# Patient Record
Sex: Female | Born: 1955 | Race: White | Hispanic: No | Smoking: Former smoker
Health system: Southern US, Community
[De-identification: ages and names within clinical notes are randomized; demographics above are authoritative.]

## PROBLEM LIST (undated history)

## (undated) DIAGNOSIS — I10 Essential (primary) hypertension: Secondary | ICD-10-CM

## (undated) DIAGNOSIS — F419 Anxiety disorder, unspecified: Secondary | ICD-10-CM

## (undated) DIAGNOSIS — F32A Depression, unspecified: Secondary | ICD-10-CM

## (undated) DIAGNOSIS — L409 Psoriasis, unspecified: Secondary | ICD-10-CM

## (undated) DIAGNOSIS — T7840XA Allergy, unspecified, initial encounter: Secondary | ICD-10-CM

## (undated) DIAGNOSIS — J45909 Unspecified asthma, uncomplicated: Secondary | ICD-10-CM

## (undated) DIAGNOSIS — E785 Hyperlipidemia, unspecified: Secondary | ICD-10-CM

## (undated) DIAGNOSIS — H409 Unspecified glaucoma: Secondary | ICD-10-CM

## (undated) DIAGNOSIS — J40 Bronchitis, not specified as acute or chronic: Secondary | ICD-10-CM

## (undated) HISTORY — DX: Psoriasis, unspecified: L40.9

## (undated) HISTORY — DX: Depression, unspecified: F32.A

## (undated) HISTORY — DX: Anxiety disorder, unspecified: F41.9

## (undated) HISTORY — DX: Hyperlipidemia, unspecified: E78.5

## (undated) HISTORY — DX: Allergy, unspecified, initial encounter: T78.40XA

## (undated) HISTORY — DX: Bronchitis, not specified as acute or chronic: J40

## (undated) HISTORY — DX: Unspecified asthma, uncomplicated: J45.909

## (undated) HISTORY — DX: Unspecified glaucoma: H40.9

## (undated) HISTORY — DX: Essential (primary) hypertension: I10

---

## 1998-05-07 ENCOUNTER — Ambulatory Visit (HOSPITAL_COMMUNITY): Admission: RE | Admit: 1998-05-07 | Discharge: 1998-05-07 | Payer: Self-pay | Admitting: Obstetrics and Gynecology

## 2000-12-08 ENCOUNTER — Other Ambulatory Visit: Admission: RE | Admit: 2000-12-08 | Discharge: 2000-12-08 | Payer: Self-pay | Admitting: Obstetrics and Gynecology

## 2000-12-16 ENCOUNTER — Encounter: Payer: Self-pay | Admitting: Obstetrics and Gynecology

## 2000-12-16 ENCOUNTER — Encounter: Admission: RE | Admit: 2000-12-16 | Discharge: 2000-12-16 | Payer: Self-pay | Admitting: Obstetrics and Gynecology

## 2001-12-26 ENCOUNTER — Encounter: Payer: Self-pay | Admitting: Gynecology

## 2001-12-26 ENCOUNTER — Encounter: Admission: RE | Admit: 2001-12-26 | Discharge: 2001-12-26 | Payer: Self-pay | Admitting: Gynecology

## 2003-03-29 ENCOUNTER — Encounter: Payer: Self-pay | Admitting: Gynecology

## 2003-03-29 ENCOUNTER — Encounter: Admission: RE | Admit: 2003-03-29 | Discharge: 2003-03-29 | Payer: Self-pay | Admitting: Gynecology

## 2003-05-02 ENCOUNTER — Other Ambulatory Visit: Admission: RE | Admit: 2003-05-02 | Discharge: 2003-05-02 | Payer: Self-pay | Admitting: Gynecology

## 2004-11-10 ENCOUNTER — Ambulatory Visit: Payer: Self-pay | Admitting: Family Medicine

## 2005-06-25 ENCOUNTER — Ambulatory Visit: Payer: Self-pay | Admitting: Family Medicine

## 2008-05-31 ENCOUNTER — Encounter: Admission: RE | Admit: 2008-05-31 | Discharge: 2008-05-31 | Payer: Self-pay | Admitting: Gynecology

## 2009-11-27 ENCOUNTER — Encounter: Admission: RE | Admit: 2009-11-27 | Discharge: 2009-11-27 | Payer: Self-pay | Admitting: Gynecology

## 2010-10-27 ENCOUNTER — Other Ambulatory Visit: Payer: Self-pay | Admitting: Gynecology

## 2010-10-27 DIAGNOSIS — Z1239 Encounter for other screening for malignant neoplasm of breast: Secondary | ICD-10-CM

## 2010-12-04 ENCOUNTER — Ambulatory Visit
Admission: RE | Admit: 2010-12-04 | Discharge: 2010-12-04 | Disposition: A | Discharge status: Home / Self Care | Payer: 59 | Source: Ambulatory Visit | Attending: Gynecology | Admitting: Gynecology

## 2010-12-04 DIAGNOSIS — Z1239 Encounter for other screening for malignant neoplasm of breast: Secondary | ICD-10-CM

## 2012-02-05 ENCOUNTER — Other Ambulatory Visit: Payer: Self-pay | Admitting: Gynecology

## 2012-02-05 DIAGNOSIS — Z1231 Encounter for screening mammogram for malignant neoplasm of breast: Secondary | ICD-10-CM

## 2012-02-17 ENCOUNTER — Ambulatory Visit
Admission: RE | Admit: 2012-02-17 | Discharge: 2012-02-17 | Disposition: A | Discharge status: Home / Self Care | Payer: BC Managed Care – PPO | Source: Ambulatory Visit | Attending: Gynecology | Admitting: Gynecology

## 2012-02-17 DIAGNOSIS — Z1231 Encounter for screening mammogram for malignant neoplasm of breast: Secondary | ICD-10-CM

## 2013-03-06 ENCOUNTER — Other Ambulatory Visit: Payer: Self-pay

## 2013-03-06 DIAGNOSIS — Z1231 Encounter for screening mammogram for malignant neoplasm of breast: Secondary | ICD-10-CM

## 2013-03-13 ENCOUNTER — Ambulatory Visit
Admission: RE | Admit: 2013-03-13 | Discharge: 2013-03-13 | Disposition: A | Discharge status: Home / Self Care | Payer: BC Managed Care – PPO | Source: Ambulatory Visit

## 2013-03-13 DIAGNOSIS — Z1231 Encounter for screening mammogram for malignant neoplasm of breast: Secondary | ICD-10-CM

## 2013-03-20 ENCOUNTER — Other Ambulatory Visit: Payer: Self-pay | Admitting: Obstetrics and Gynecology

## 2013-03-20 DIAGNOSIS — Z780 Asymptomatic menopausal state: Secondary | ICD-10-CM

## 2013-04-05 ENCOUNTER — Ambulatory Visit
Admission: RE | Admit: 2013-04-05 | Discharge: 2013-04-05 | Disposition: A | Discharge status: Home / Self Care | Payer: BC Managed Care – PPO | Source: Ambulatory Visit | Attending: Obstetrics and Gynecology | Admitting: Obstetrics and Gynecology

## 2013-04-05 DIAGNOSIS — Z780 Asymptomatic menopausal state: Secondary | ICD-10-CM

## 2014-01-04 ENCOUNTER — Other Ambulatory Visit: Payer: Self-pay

## 2014-01-04 DIAGNOSIS — Z1231 Encounter for screening mammogram for malignant neoplasm of breast: Secondary | ICD-10-CM

## 2014-03-14 ENCOUNTER — Encounter (INDEPENDENT_AMBULATORY_CARE_PROVIDER_SITE_OTHER): Payer: Self-pay

## 2014-03-14 ENCOUNTER — Ambulatory Visit
Admission: RE | Admit: 2014-03-14 | Discharge: 2014-03-14 | Disposition: A | Discharge status: Home / Self Care | Payer: BC Managed Care – PPO | Source: Ambulatory Visit

## 2014-03-14 DIAGNOSIS — Z1231 Encounter for screening mammogram for malignant neoplasm of breast: Secondary | ICD-10-CM

## 2015-03-07 ENCOUNTER — Other Ambulatory Visit: Payer: Self-pay

## 2015-03-07 DIAGNOSIS — Z1231 Encounter for screening mammogram for malignant neoplasm of breast: Secondary | ICD-10-CM

## 2015-03-25 DIAGNOSIS — F411 Generalized anxiety disorder: Secondary | ICD-10-CM | POA: Insufficient documentation

## 2015-04-01 ENCOUNTER — Other Ambulatory Visit: Payer: Self-pay | Admitting: Adult Health Nurse Practitioner

## 2015-04-01 DIAGNOSIS — E785 Hyperlipidemia, unspecified: Secondary | ICD-10-CM | POA: Insufficient documentation

## 2015-04-01 DIAGNOSIS — N959 Unspecified menopausal and perimenopausal disorder: Secondary | ICD-10-CM

## 2015-04-29 ENCOUNTER — Ambulatory Visit: Payer: Self-pay

## 2015-04-29 ENCOUNTER — Other Ambulatory Visit: Payer: Self-pay

## 2015-04-29 ENCOUNTER — Ambulatory Visit
Admission: RE | Admit: 2015-04-29 | Discharge: 2015-04-29 | Disposition: A | Discharge status: Home / Self Care | Payer: BLUE CROSS/BLUE SHIELD | Source: Ambulatory Visit

## 2015-04-29 DIAGNOSIS — Z1231 Encounter for screening mammogram for malignant neoplasm of breast: Secondary | ICD-10-CM

## 2015-06-17 ENCOUNTER — Other Ambulatory Visit: Payer: BLUE CROSS/BLUE SHIELD

## 2015-07-29 ENCOUNTER — Ambulatory Visit
Admission: RE | Admit: 2015-07-29 | Discharge: 2015-07-29 | Disposition: A | Discharge status: Home / Self Care | Payer: BLUE CROSS/BLUE SHIELD | Source: Ambulatory Visit | Attending: Adult Health Nurse Practitioner | Admitting: Adult Health Nurse Practitioner

## 2015-07-29 DIAGNOSIS — N959 Unspecified menopausal and perimenopausal disorder: Secondary | ICD-10-CM

## 2016-04-30 ENCOUNTER — Other Ambulatory Visit: Payer: Self-pay | Admitting: Adult Health Nurse Practitioner

## 2016-04-30 DIAGNOSIS — Z1231 Encounter for screening mammogram for malignant neoplasm of breast: Secondary | ICD-10-CM

## 2016-05-18 ENCOUNTER — Ambulatory Visit
Admission: RE | Admit: 2016-05-18 | Discharge: 2016-05-18 | Disposition: A | Discharge status: Home / Self Care | Payer: BLUE CROSS/BLUE SHIELD | Source: Ambulatory Visit | Attending: Adult Health Nurse Practitioner | Admitting: Adult Health Nurse Practitioner

## 2016-05-18 DIAGNOSIS — Z1231 Encounter for screening mammogram for malignant neoplasm of breast: Secondary | ICD-10-CM

## 2017-03-23 ENCOUNTER — Other Ambulatory Visit: Payer: Self-pay | Admitting: Adult Health Nurse Practitioner

## 2017-03-23 DIAGNOSIS — Z1231 Encounter for screening mammogram for malignant neoplasm of breast: Secondary | ICD-10-CM

## 2017-06-02 ENCOUNTER — Ambulatory Visit
Admission: RE | Admit: 2017-06-02 | Discharge: 2017-06-02 | Disposition: A | Discharge status: Home / Self Care | Payer: Commercial Managed Care - PPO | Source: Ambulatory Visit | Attending: Adult Health Nurse Practitioner | Admitting: Adult Health Nurse Practitioner

## 2017-06-02 DIAGNOSIS — Z1231 Encounter for screening mammogram for malignant neoplasm of breast: Secondary | ICD-10-CM

## 2017-12-30 DIAGNOSIS — J302 Other seasonal allergic rhinitis: Secondary | ICD-10-CM | POA: Insufficient documentation

## 2017-12-30 DIAGNOSIS — R7309 Other abnormal glucose: Secondary | ICD-10-CM | POA: Insufficient documentation

## 2018-07-01 DIAGNOSIS — E559 Vitamin D deficiency, unspecified: Secondary | ICD-10-CM | POA: Insufficient documentation

## 2018-07-04 ENCOUNTER — Other Ambulatory Visit: Payer: Self-pay | Admitting: Adult Health Nurse Practitioner

## 2018-07-04 DIAGNOSIS — Z1231 Encounter for screening mammogram for malignant neoplasm of breast: Secondary | ICD-10-CM

## 2018-09-07 ENCOUNTER — Ambulatory Visit
Admission: RE | Admit: 2018-09-07 | Discharge: 2018-09-07 | Disposition: A | Discharge status: Home / Self Care | Payer: Commercial Managed Care - PPO | Source: Ambulatory Visit | Attending: Adult Health Nurse Practitioner | Admitting: Adult Health Nurse Practitioner

## 2018-09-07 DIAGNOSIS — Z1231 Encounter for screening mammogram for malignant neoplasm of breast: Secondary | ICD-10-CM

## 2019-07-06 DIAGNOSIS — Z9229 Personal history of other drug therapy: Secondary | ICD-10-CM

## 2019-07-06 HISTORY — DX: Personal history of other drug therapy: Z92.29

## 2019-08-05 ENCOUNTER — Other Ambulatory Visit: Payer: Self-pay

## 2019-08-05 DIAGNOSIS — Z20822 Contact with and (suspected) exposure to covid-19: Secondary | ICD-10-CM

## 2019-08-06 LAB — NOVEL CORONAVIRUS, NAA: SARS-CoV-2, NAA: DETECTED — AB

## 2019-08-07 ENCOUNTER — Telehealth: Payer: Self-pay | Admitting: Critical Care Medicine

## 2019-08-07 NOTE — Telephone Encounter (Signed)
I connected with this patient regarding her positive Covid test.  Since the 29th she has had headaches dizziness sore throat nasal congestion body aches and fatigue but no cough or shortness of breath.  She has had some diarrhea.  Her urine output is adequate.  She has been staying in isolation.  She works at the [employer] and other workers were positive and she was exposed there.  The [employer] is now closed.  She will let her supervisor know her positive result and I will also email it to her.  The patient has a primary care provider she will follow up with that primary care provider as well.  The patient was asked to go to the emergency room if her symptoms worsen.  She knows to stay in isolation at least until November 9

## 2019-09-14 ENCOUNTER — Other Ambulatory Visit: Payer: Self-pay

## 2019-09-15 ENCOUNTER — Ambulatory Visit: Payer: Self-pay | Admitting: Family Medicine

## 2019-09-20 ENCOUNTER — Other Ambulatory Visit: Payer: Self-pay

## 2019-09-21 ENCOUNTER — Encounter: Payer: Self-pay | Admitting: Family Medicine

## 2019-09-21 ENCOUNTER — Other Ambulatory Visit: Payer: Self-pay

## 2019-09-21 ENCOUNTER — Ambulatory Visit (INDEPENDENT_AMBULATORY_CARE_PROVIDER_SITE_OTHER): Payer: Commercial Managed Care - PPO | Admitting: Family Medicine

## 2019-09-21 VITALS — BP 150/84 | HR 82 | Temp 97.4°F | Ht 64.5 in | Wt 255.4 lb

## 2019-09-21 DIAGNOSIS — Z1159 Encounter for screening for other viral diseases: Secondary | ICD-10-CM

## 2019-09-21 DIAGNOSIS — F411 Generalized anxiety disorder: Secondary | ICD-10-CM

## 2019-09-21 DIAGNOSIS — E559 Vitamin D deficiency, unspecified: Secondary | ICD-10-CM

## 2019-09-21 DIAGNOSIS — Z6841 Body Mass Index (BMI) 40.0 and over, adult: Secondary | ICD-10-CM

## 2019-09-21 DIAGNOSIS — E785 Hyperlipidemia, unspecified: Secondary | ICD-10-CM

## 2019-09-21 DIAGNOSIS — J302 Other seasonal allergic rhinitis: Secondary | ICD-10-CM | POA: Diagnosis not present

## 2019-09-21 DIAGNOSIS — Z23 Encounter for immunization: Secondary | ICD-10-CM

## 2019-09-21 MED ORDER — SHINGRIX 50 MCG/0.5ML IM SUSR: 0.5000 mL | Freq: Once | INTRAMUSCULAR | 0 refills | Status: AC

## 2019-09-21 MED ORDER — SIMVASTATIN 40 MG PO TABS: 40.0000 mg | ORAL_TABLET | Freq: Every day | ORAL | 1 refills | Status: DC

## 2019-09-21 MED ORDER — MONTELUKAST SODIUM 10 MG PO TABS: 10.0000 mg | ORAL_TABLET | Freq: Every day | ORAL | 1 refills | Status: DC

## 2019-09-21 MED ORDER — EZETIMIBE 10 MG PO TABS: 10.0000 mg | ORAL_TABLET | Freq: Every day | ORAL | 1 refills | Status: DC

## 2019-09-21 MED ORDER — SERTRALINE HCL 100 MG PO TABS: 150.0000 mg | ORAL_TABLET | Freq: Every day | ORAL | 1 refills | Status: DC

## 2019-09-21 NOTE — Progress Notes (Signed)
New Patient Office Visit  Assessment & Plan:  1. Anxiety, generalized - Encouraged patient to talk with her husband and explain what is going on with her.  Encouraged her to resume listening to music, even if it was through headphones hook to her phone.  Encouraged her to resume her dancing and exercise.  Offered a referral for counseling but she declines at this time.  I did increase her sertraline from 100 mg to 150 mg once daily. - sertraline (ZOLOFT) 100 MG tablet; Take 1.5 tablets (150 mg total) by mouth daily.  Dispense: 135 tablet; Refill: 1  2. Chronic seasonal allergic rhinitis - Well controlled on current regimen.  - montelukast (SINGULAIR) 10 MG tablet; Take 1 tablet (10 mg total) by mouth at bedtime.  Dispense: 90 tablet; Refill: 1  3. Dyslipidemia - simvastatin (ZOCOR) 40 MG tablet; Take 1 tablet (40 mg total) by mouth at bedtime.  Dispense: 90 tablet; Refill: 1 - ezetimibe (ZETIA) 10 MG tablet; Take 1 tablet (10 mg total) by mouth daily.  Dispense: 90 tablet; Refill: 1  4. Vitamin D deficiency - Patient does take a vitamin D supplement.  5. Morbid obesity with BMI of 40.0-44.9, adult (HCC) - Encouraged patient to resume daily exercise.  Discussed dietary adjustments.  Education provided on obesity with emphasis on diet and exercise control.  6. Immunization due - SHINGRIX injection; Inject 0.5 mLs into the muscle once for 1 dose.  Dispense: 0.5 mL; Refill: 0  7. Encounter for hepatitis C screening test for low risk patient - Hep C antibodies ordered.   Patient reports she gets her lab work for free through work.  I gave her a written prescription for a vitamin D level, lipid panel, CMP, CBC, and hepatitis C antibodies.   Follow-up: Return in about 3 months (around 12/20/2019) for anxiety.   Deliah BostonBritney Kielan Dreisbach, MSN, APRN, FNP-C [facility]  Subjective:  [name] Pursley, female    DOB: 01/17/1956  Age: 63 y.o. MRN: [id number]  Patient  Care Team: Gwenlyn FudgeJoyce, Treyvonne Tata F, FNP as PCP - General (Family Medicine) Chalmers GuestWhitaker, Roy, MD as Consulting Physician (Ophthalmology) Janalyn Harderafeen, Stuart, MD as Consulting Physician (Dermatology) Sandford Crazehompson, Matt, DC as Consulting Physician (Chiropractic Medicine)  CC:  Chief Complaint  Patient presents with  . New Patient (Initial Visit)  . Establish Care    HPI Jasmine Kim Fulco presents to establish care. She is transferring care from Dr. Lowella Kindley [place]'s office as he has retired and the office has closed.   Patient has concerns regarding her coping skills, weight, hepatitis C screening, and Shingrix.   Patient reports that she was prescribed albuterol due to bronchitis.  She also used it when she had Covid at the end of October.  She does still sometimes experience shortness of breath which is relieved with the albuterol.  Patient reports she used to cope with stress by dancing every morning and exercising with her weights.  She also found comfort and completing tasks around the house while listening to the stereo.  Since the current pandemic her husband has been working from home.  She feels she no longer has any time for herself as he is always home.  She is no longer dancing, exercising, or listening to the stereo.  She seems very anxious about tasks at home that are being neglected.  She feels she is unable to get anything done as he is always there and asking her questions.   Dep[facility] screen [facility] 2/9 09/21/2019  Decreased Interest 2  Down, Depressed, Hopeless 1  PHQ - 2 Score 3  Altered sleeping 2  Tired, decreased energy 3  Change in appetite 2  Feeling bad or failure about yourself  1  Trouble concentrating 2  Moving slowly or fidgety/restless 0  Suicidal thoughts 1  PHQ-9 Score 14  Difficult doing work/chores Somewhat difficult   Patient does not have any suicidal plans.  GAD 7 : Generalized Anxiety Score 09/21/2019  Nervous, Anxious, on Edge 3  Control/stop worrying 1  Worry too much -  different things 2  Trouble relaxing 1  Restless 3  Easily annoyed or irritable 2  Afraid - awful might happen 1  Total GAD 7 Score 13  Anxiety Difficulty Somewhat difficult    ROS  Current Outpatient Medications:  .  albuterol (VENTOLIN HFA) 108 (90 Base) MCG/ACT inhaler, SMARTSIG:2 Puff(s) By Mouth Every 4 Hours PRN, Disp: , Rfl:  .  aspirin EC 81 MG tablet, Take 81 mg by mouth daily., Disp: , Rfl:  .  brimonidine (ALPHAGAN) 0.15 % ophthalmic solution, 1 drop 3 (three) times daily., Disp: , Rfl:  .  Calcium Carbonate-Vit D-Min (CALCIUM 1200 PO), Take by mouth., Disp: , Rfl:  .  cholecalciferol (VITAMIN D3) 25 MCG (1000 UT) tablet, Take 1,000 Units by mouth daily., Disp: , Rfl:  .  Clotrimazole (MYCELEX OTC EX), Apply topically., Disp: , Rfl:  .  ezetimibe (ZETIA) 10 MG tablet, Take 1 tablet (10 mg total) by mouth daily., Disp: 90 tablet, Rfl: 1 .  halobetasol (ULTRAVATE) 0.05 % cream, Apply topically 2 (two) times daily., Disp: , Rfl:  .  Homeopathic Products (ZICAM COLD REMEDY NA), Place into the nose., Disp: , Rfl:  .  minocycline (DYNACIN) 50 MG tablet, Take 50 mg by mouth 2 (two) times daily., Disp: , Rfl:  .  montelukast (SINGULAIR) 10 MG tablet, Take 1 tablet (10 mg total) by mouth at bedtime., Disp: 90 tablet, Rfl: 1 .  nystatin-triamcinolone (MYCOLOG II) cream, Apply 1 application topically 2 (two) times daily., Disp: , Rfl:  .  sertraline (ZOLOFT) 100 MG tablet, Take 1.5 tablets (150 mg total) by mouth daily., Disp: 135 tablet, Rfl: 1 .  simvastatin (ZOCOR) 40 MG tablet, Take 1 tablet (40 mg total) by mouth at bedtime., Disp: 90 tablet, Rfl: 1 .  vitamin E (VITAMIN E) 1000 UNIT capsule, Take 1,000 Units by mouth daily., Disp: , Rfl:  .  SHINGRIX injection, Inject 0.5 mLs into the muscle once for 1 dose., Disp: 0.5 mL, Rfl: 0  Allergies  Allergen Reactions  . Procaine Shortness Of Breath  . Niacin Hives    Past Medical History:  Diagnosis Date  . Allergy   . Bronchitis    . Glaucoma   . History of rabies vaccination 07/2019   run in with raccoon  . Hyperlipidemia   . Psoriasis     Past Surgical History:  Procedure Laterality Date  . ABDOMINAL HYSTERECTOMY  1998    Family History  Problem Relation Age of Onset  . Uterine cancer Mother   . Pancreatic cancer Father   . Cancer Brother        Unknown type  . Heart disease Sister   . Colon cancer Sister        69s  . Diabetes Maternal Grandmother   . Hypertension Maternal Grandmother   . Arthritis Maternal Grandmother     Social History   Socioeconomic History  . Marital status: Married  Spouse name: Not on file  . Number of children: Not on file  . Years of education: Not on file  . Highest education level: Not on file  Occupational History  . Not on file  Tobacco Use  . Smoking status: Former Research scientist (life sciences)  . Smokeless tobacco: Never Used  Substance and Sexual Activity  . Alcohol use: Yes    Comment: occ  . Drug use: Never  . Sexual activity: Not Currently  Other Topics Concern  . Not on file  Social History Narrative  . Not on file   Social Determinants of Health   Financial Resource Strain:   . Difficulty of Paying Living Expenses: Not on file  Food Insecurity:   . Worried About Charity fundraiser in the Last Year: Not on file  . Ran Out of Food in the Last Year: Not on file  Transportation Needs:   . Lack of Transportation (Medical): Not on file  . Lack of Transportation (Non-Medical): Not on file  Physical Activity:   . Days of Exercise per Week: Not on file  . Minutes of Exercise per Session: Not on file  Stress:   . Feeling of Stress : Not on file  Social Connections:   . Frequency of Communication with Friends and Family: Not on file  . Frequency of Social Gatherings with Friends and Family: Not on file  . Attends Religious Services: Not on file  . Active Member of Clubs or Organizations: Not on file  . Attends Archivist Meetings: Not on file  .  Marital Status: Not on file  Intimate Partner Violence:   . Fear of Current or Ex-Partner: Not on file  . Emotionally Abused: Not on file  . Physically Abused: Not on file  . Sexually Abused: Not on file    Objective:   Today's Vitals: BP (!) 150/84   Pulse 82   Temp (!) 97.4 F (36.3 C) (Temporal)   Ht 5' 4.5" (1.638 m)   Wt 255 lb 6.4 oz (115.8 kg)   BMI 43.16 kg/m   Physical Exam Vitals reviewed.  Constitutional:      General: She is not in acute distress.    Appearance: Normal appearance. She is morbidly obese. She is not ill-appearing, toxic-appearing or diaphoretic.  HENT:     Head: Normocephalic and atraumatic.  Eyes:     General: No scleral icterus.       Right eye: No discharge.        Left eye: No discharge.     Conjunctiva/sclera: Conjunctivae normal.  Cardiovascular:     Rate and Rhythm: Normal rate and regular rhythm.     Heart sounds: Normal heart sounds. No murmur. No friction rub. No gallop.   Pulmonary:     Effort: Pulmonary effort is normal. No respiratory distress.     Breath sounds: Normal breath sounds. No stridor. No wheezing, rhonchi or rales.  Musculoskeletal:        General: Normal range of motion.     Cervical back: Normal range of motion.  Skin:    General: Skin is warm and dry.     Capillary Refill: Capillary refill takes less than 2 seconds.  Neurological:     General: No focal deficit present.     Mental Status: She is alert and oriented to person, place, and time. Mental status is at baseline.  Psychiatric:        Mood and Affect: Mood normal.  Behavior: Behavior normal.        Thought Content: Thought content normal.        Judgment: Judgment normal.

## 2019-09-21 NOTE — Patient Instructions (Signed)

## 2019-10-28 IMAGING — MG DIGITAL SCREENING BILATERAL MAMMOGRAM WITH TOMO AND CAD
8 series · 8 of 24 positions shown · non-contrast
Comparison: Previous exam(s).

ACR Breast Density Category a: The breast tissue is almost entirely
fatty.

CLINICAL DATA: Screening.

EXAM:
DIGITAL SCREENING BILATERAL MAMMOGRAM WITH TOMO AND CAD

[L MLO synth-2D]
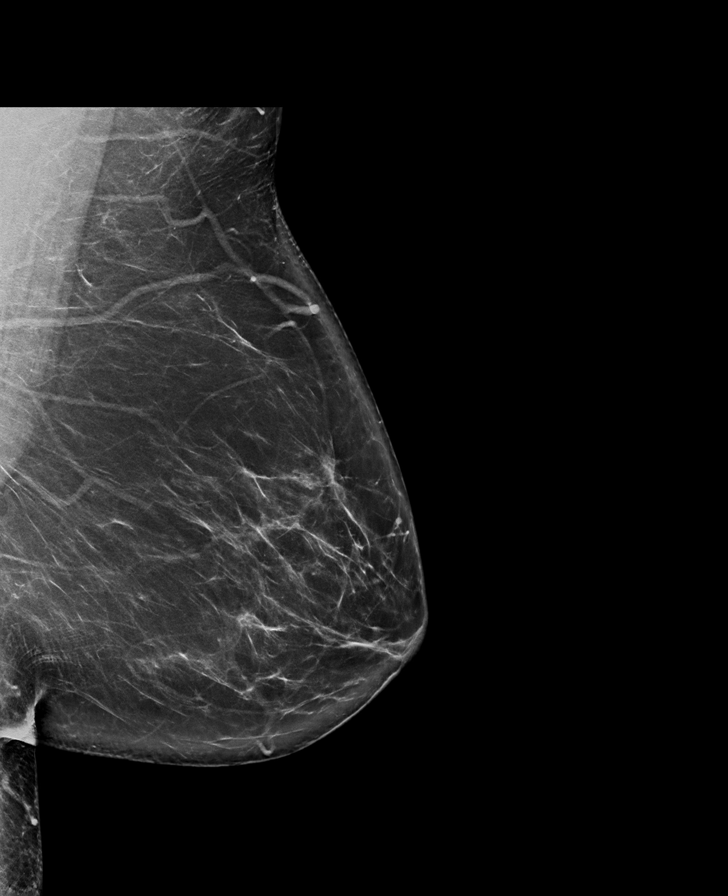

[R MLO synth-2D]
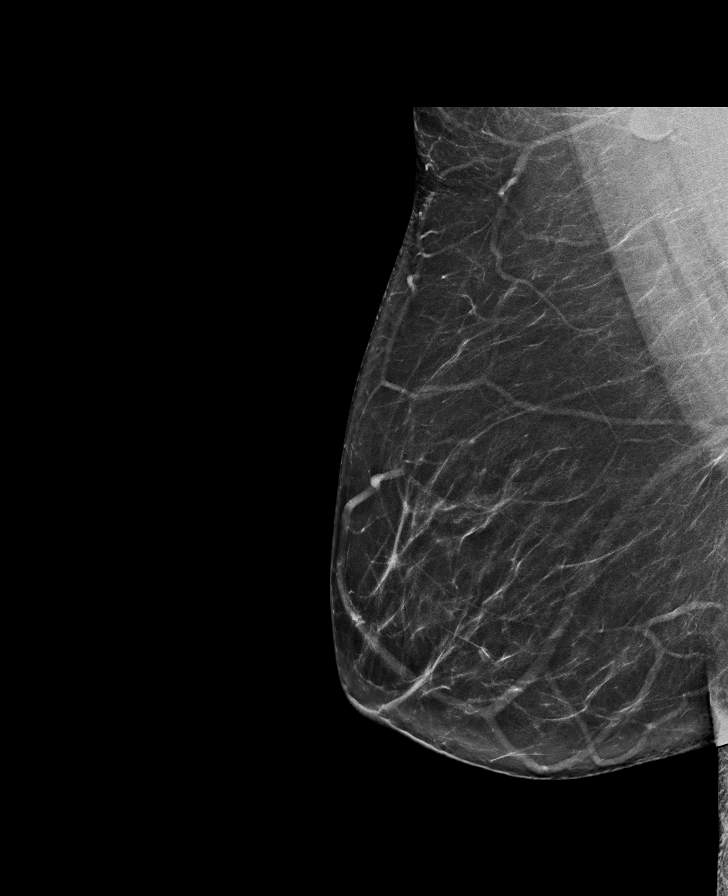

[R CC synth-2D]
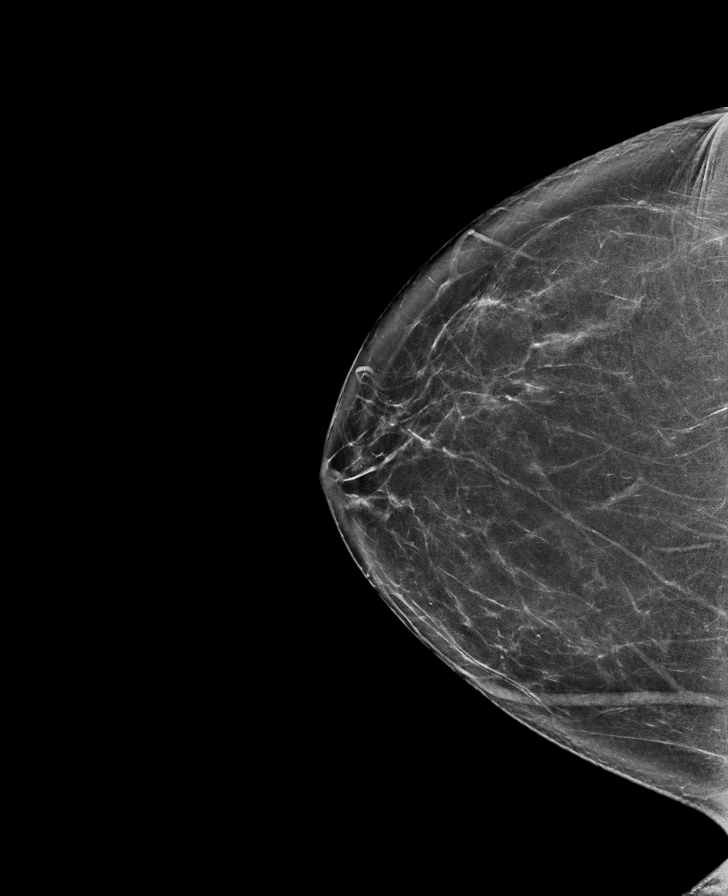

[L CC synth-2D]
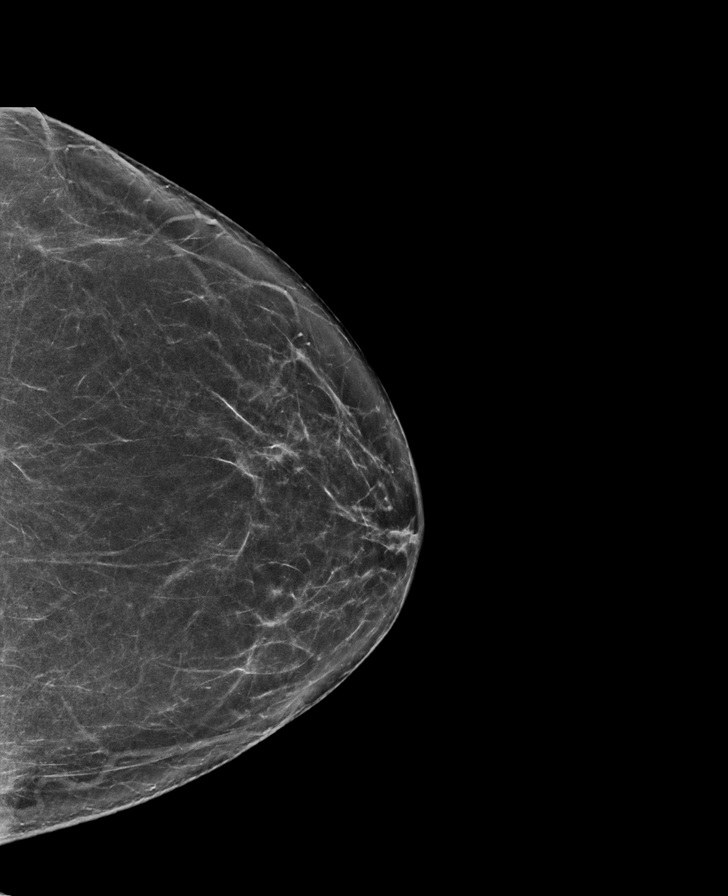

[L CC tomo · tomo slice 37/74.0]
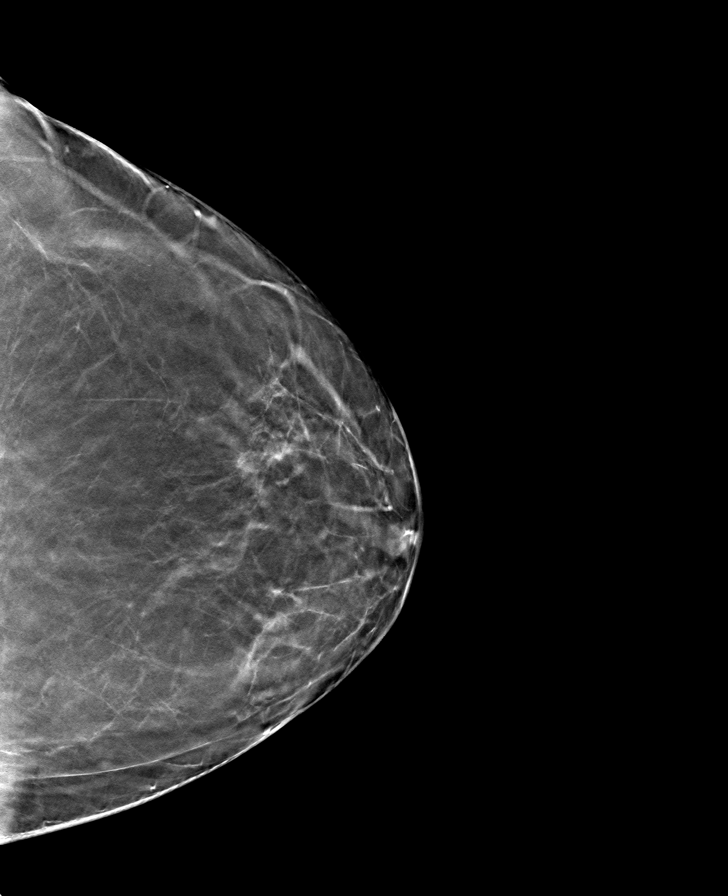

[L MLO tomo · tomo slice 45/89.0]
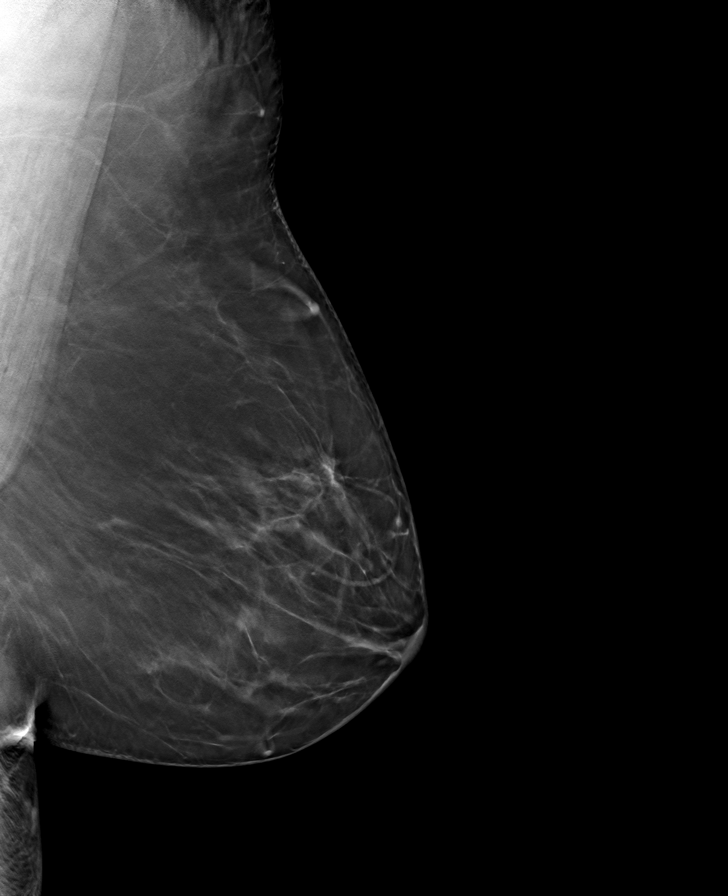

[R MLO tomo · tomo slice 42/83.0]
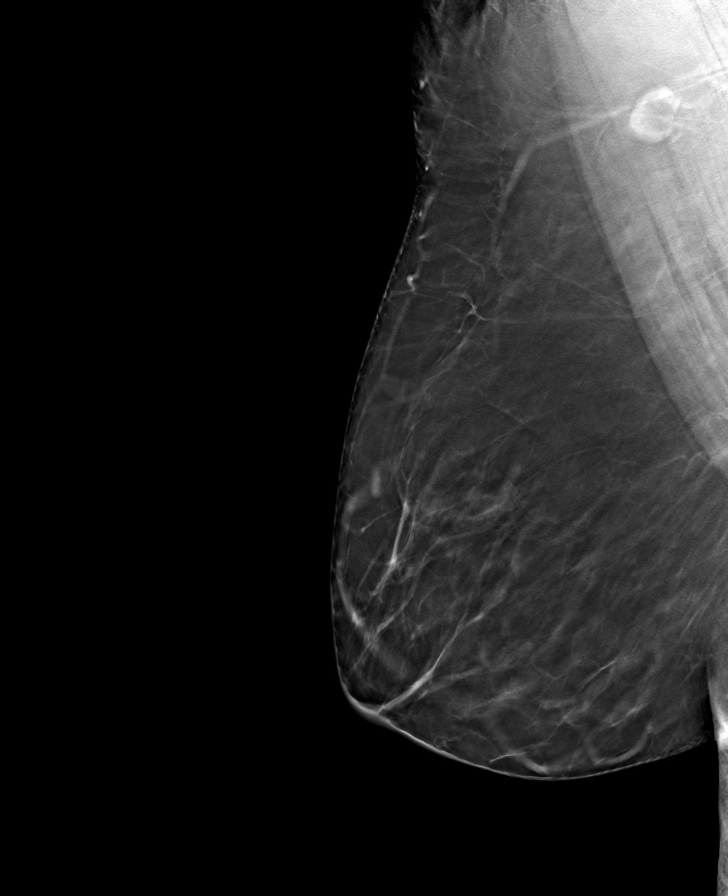

[R CC tomo · tomo slice 39/78.0]
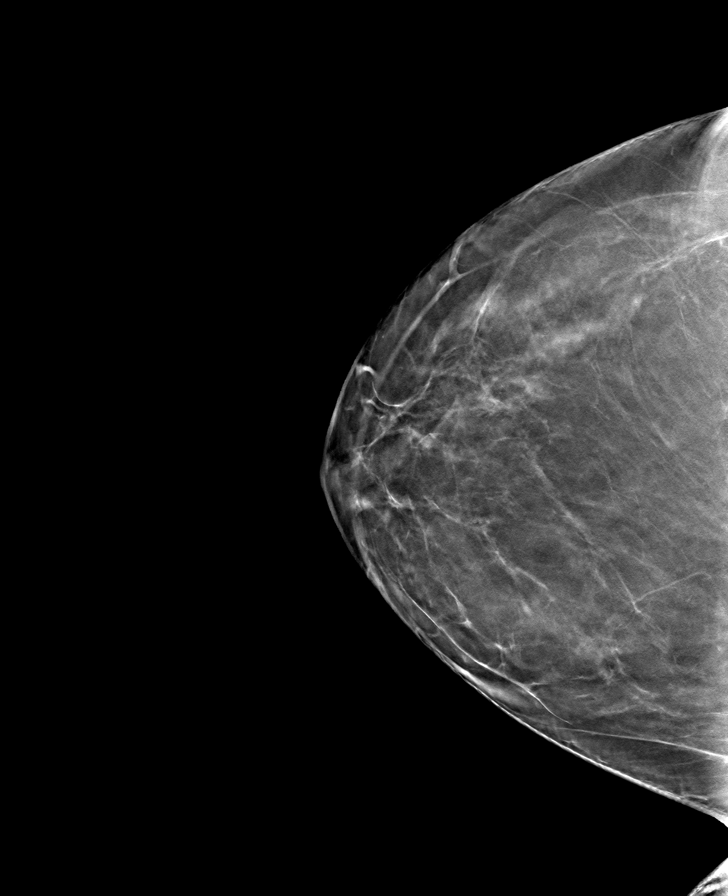

[8 of 24 positions shown; findings below may reference images not displayed]

FINDINGS: There are no findings suspicious for malignancy. Images were
processed with CAD.
IMPRESSION: No mammographic evidence of malignancy. A result letter of this
screening mammogram will be mailed directly to the patient.

RECOMMENDATION:
Screening mammogram in one year. (Code:[id number]-[id number])

BI-RADS CATEGORY  1: Negative.

## 2019-11-13 ENCOUNTER — Other Ambulatory Visit: Payer: Self-pay | Admitting: Family Medicine

## 2019-11-13 DIAGNOSIS — Z1231 Encounter for screening mammogram for malignant neoplasm of breast: Secondary | ICD-10-CM

## 2019-11-23 ENCOUNTER — Encounter: Payer: Self-pay | Admitting: Family Medicine

## 2019-11-28 ENCOUNTER — Telehealth: Payer: Self-pay | Admitting: Family Medicine

## 2019-11-28 NOTE — Telephone Encounter (Signed)
Colon report and path requested from [facility]. Dr. Jennye Boroughs office. Fax number [phone]

## 2019-11-28 NOTE — Telephone Encounter (Signed)
Can we please call to request colonoscopy from Dr. Kinnie Scales? I have attempted to request via Epic already and did not receive it.

## 2019-12-21 ENCOUNTER — Other Ambulatory Visit: Payer: Self-pay

## 2019-12-21 ENCOUNTER — Ambulatory Visit
Admission: RE | Admit: 2019-12-21 | Discharge: 2019-12-21 | Disposition: A | Discharge status: Home / Self Care | Payer: Commercial Managed Care - PPO | Source: Ambulatory Visit | Attending: Family Medicine | Admitting: Family Medicine

## 2019-12-21 ENCOUNTER — Other Ambulatory Visit: Payer: Self-pay | Admitting: *Deleted

## 2019-12-21 DIAGNOSIS — E785 Hyperlipidemia, unspecified: Secondary | ICD-10-CM

## 2019-12-21 DIAGNOSIS — Z1231 Encounter for screening mammogram for malignant neoplasm of breast: Secondary | ICD-10-CM

## 2019-12-21 MED ORDER — SIMVASTATIN 40 MG PO TABS: 40.0000 mg | ORAL_TABLET | Freq: Every day | ORAL | 0 refills | Status: DC

## 2020-01-17 ENCOUNTER — Other Ambulatory Visit: Payer: Self-pay | Admitting: Family Medicine

## 2020-01-17 DIAGNOSIS — E785 Hyperlipidemia, unspecified: Secondary | ICD-10-CM

## 2020-01-18 MED ORDER — SIMVASTATIN 40 MG PO TABS: 40.0000 mg | ORAL_TABLET | Freq: Every day | ORAL | 0 refills | Status: DC

## 2020-01-18 NOTE — Telephone Encounter (Signed)
Please schedule appt for follow up before any more refills

## 2020-01-23 ENCOUNTER — Telehealth: Payer: Self-pay

## 2020-01-23 ENCOUNTER — Other Ambulatory Visit: Payer: Self-pay | Admitting: Family Medicine

## 2020-01-23 DIAGNOSIS — E785 Hyperlipidemia, unspecified: Secondary | ICD-10-CM

## 2020-01-23 DIAGNOSIS — F411 Generalized anxiety disorder: Secondary | ICD-10-CM

## 2020-01-23 MED ORDER — SERTRALINE HCL 100 MG PO TABS: 150.0000 mg | ORAL_TABLET | Freq: Every day | ORAL | 1 refills | Status: DC

## 2020-01-23 NOTE — Telephone Encounter (Signed)
Who increased the dosage?

## 2020-01-23 NOTE — Telephone Encounter (Signed)
Sertraline refilled.  

## 2020-01-23 NOTE — Telephone Encounter (Signed)
Spoke with patient, she had gotten confused about her medications.  She had increased her Sertraline to 1-1/2 pills and needs a refill of that, not her Simvastatin.  She accidentally sent [place] a refill request for the wrong medication.  Patient reports Sertraline was increased at her last office visit.

## 2020-01-23 NOTE — Telephone Encounter (Signed)
Pharmacy has requested a refill of patient's Simvastatin.  Our records indicate patient is taking 40 mg but Jasmine Kim received something that leads her to think that dosage was changed to 60 mg.  Tried to contact the patient to confirm.  Left message for patient to call back.

## 2020-06-04 ENCOUNTER — Encounter: Payer: Self-pay | Admitting: Family Medicine

## 2020-06-18 ENCOUNTER — Encounter: Payer: Self-pay | Admitting: Family Medicine

## 2020-06-18 DIAGNOSIS — R7303 Prediabetes: Secondary | ICD-10-CM | POA: Insufficient documentation

## 2020-06-18 HISTORY — DX: Prediabetes: R73.03

## 2020-06-18 LAB — LIPID PANEL
Cholesterol: 225 — AB (ref 0–200)
HDL: 51 (ref 35–70)
LDL Cholesterol: 147
Triglycerides: 149 (ref 40–160)

## 2020-06-18 LAB — HEPATIC FUNCTION PANEL
ALT: 25 (ref 7–35)
AST: 19 (ref 13–35)
Alkaline Phosphatase: 94 (ref 25–125)

## 2020-06-18 LAB — CBC AND DIFFERENTIAL
HCT: 39 (ref 36–46)
Hemoglobin: 13.2 (ref 12.0–16.0)
WBC: 7.6

## 2020-06-18 LAB — COMPREHENSIVE METABOLIC PANEL
Albumin: 4.6 (ref 3.5–5.0)
Calcium: 9.3 (ref 8.7–10.7)
GFR calc non Af Amer: 85
Globulin: 2.6

## 2020-06-18 LAB — BASIC METABOLIC PANEL
BUN: 13 (ref 4–21)
CO2: 23 — AB (ref 13–22)
Chloride: 102 (ref 99–108)
Creatinine: 0.7 (ref 0.5–1.1)
Glucose: 111
Potassium: 4.3 (ref 3.4–5.3)
Sodium: 141 (ref 137–147)

## 2020-06-18 LAB — CBC: RBC: 4.37 (ref 3.87–5.11)

## 2020-06-18 LAB — HEMOGLOBIN A1C: Hemoglobin A1C: 6

## 2020-06-26 ENCOUNTER — Ambulatory Visit (INDEPENDENT_AMBULATORY_CARE_PROVIDER_SITE_OTHER): Payer: Commercial Managed Care - PPO | Admitting: Family Medicine

## 2020-06-26 ENCOUNTER — Encounter: Payer: Self-pay | Admitting: Family Medicine

## 2020-06-26 ENCOUNTER — Other Ambulatory Visit: Payer: Self-pay

## 2020-06-26 VITALS — BP 151/87 | HR 69 | Temp 97.7°F | Ht 64.5 in | Wt 253.8 lb

## 2020-06-26 DIAGNOSIS — J302 Other seasonal allergic rhinitis: Secondary | ICD-10-CM

## 2020-06-26 DIAGNOSIS — E785 Hyperlipidemia, unspecified: Secondary | ICD-10-CM

## 2020-06-26 DIAGNOSIS — F411 Generalized anxiety disorder: Secondary | ICD-10-CM

## 2020-06-26 DIAGNOSIS — R7303 Prediabetes: Secondary | ICD-10-CM | POA: Diagnosis not present

## 2020-06-26 DIAGNOSIS — E559 Vitamin D deficiency, unspecified: Secondary | ICD-10-CM

## 2020-06-26 DIAGNOSIS — M1990 Unspecified osteoarthritis, unspecified site: Secondary | ICD-10-CM

## 2020-06-26 DIAGNOSIS — M25522 Pain in left elbow: Secondary | ICD-10-CM

## 2020-06-26 DIAGNOSIS — Z0000 Encounter for general adult medical examination without abnormal findings: Secondary | ICD-10-CM

## 2020-06-26 DIAGNOSIS — Z6841 Body Mass Index (BMI) 40.0 and over, adult: Secondary | ICD-10-CM

## 2020-06-26 MED ORDER — MONTELUKAST SODIUM 10 MG PO TABS: 10.0000 mg | ORAL_TABLET | Freq: Every day | ORAL | 1 refills | Status: DC

## 2020-06-26 MED ORDER — ALBUTEROL SULFATE HFA 108 (90 BASE) MCG/ACT IN AERS: INHALATION_SPRAY | RESPIRATORY_TRACT | 2 refills | Status: DC

## 2020-06-26 MED ORDER — PREDNISONE 10 MG (21) PO TBPK: ORAL_TABLET | ORAL | 0 refills | Status: DC

## 2020-06-26 MED ORDER — EZETIMIBE 10 MG PO TABS: 10.0000 mg | ORAL_TABLET | Freq: Every day | ORAL | 1 refills | Status: DC

## 2020-06-26 MED ORDER — SERTRALINE HCL 100 MG PO TABS: 200.0000 mg | ORAL_TABLET | Freq: Every day | ORAL | 1 refills | Status: DC

## 2020-06-26 NOTE — Progress Notes (Signed)
Assessment & Plan:  1. Anxiety, generalized - Uncontrolled.  Sertraline increased from 150 mg to 200 mg once daily.  Recommended a referral for counseling to help with her coping skills, but patient declines. - sertraline (ZOLOFT) 100 MG tablet; Take 2 tablets (200 mg total) by mouth daily.  Dispense: 180 tablet; Refill: 1  2. Chronic seasonal allergic rhinitis - Well controlled on current regimen.  - montelukast (SINGULAIR) 10 MG tablet; Take 1 tablet (10 mg total) by mouth at bedtime.  Dispense: 90 tablet; Refill: 1  3. Dyslipidemia - Uncontrolled per last lab work.  Patient to work on lifestyle changes. - ezetimibe (ZETIA) 10 MG tablet; Take 1 tablet (10 mg total) by mouth daily.  Dispense: 90 tablet; Refill: 1  4. Prediabetes - Diet and exercise encouraged to keep from progressing to diabetes.  5. Vitamin D deficiency - Control unknown as lab work was not completed as requested.  Patient not agreeable to completing today.  6. Morbid obesity with BMI of 40.0-44.9, adult (HCC) - Diet and exercise encouraged.  7. Healthcare maintenance - Patient is going to check on price of Shingrix at her pharmacy.  She is also going to try to get her colonoscopy record as we have been unsuccessful.  8. Left elbow pain - predniSONE (STERAPRED UNI-PAK 21 TAB) 10 MG (21) TBPK tablet; As directed x 6 days  Dispense: 21 tablet; Refill: 0  9. Arthritis  - Encouraged patient to take Advil 400-600 mg every 6-8 hours as needed for pain.    Return in about 6 weeks (around 08/07/2020) for anxiety/depression.  Deliah Boston, MSN, APRN, FNP-C [facility]  Subjective:    [name], female    DOB: March 06, 1956, 64 y.o.   MRN: [id number]  Patient Care Team: Gwenlyn Fudge, FNP as PCP - General (Family Medicine) Chalmers Guest, MD as Consulting Physician (Ophthalmology) Janalyn Harder, MD as Consulting Physician (Dermatology) Sandford Craze, DC as Consulting  Physician (Chiropractic Medicine)   Chief Complaint:  Chief Complaint  Patient presents with  . Diabetes    check up of chronic medical conditions  . Hyperlipidemia  . Elbow Pain    Left - Patient states it has been going on x 1 month    HPI: Jasmine Kim is a 64 y.o. female presenting on 06/26/2020 for Diabetes (check up of chronic medical conditions), Hyperlipidemia, and Elbow Pain (Left - Patient states it has been going on x 1 month)  At patient's last visit sertraline was increased from 100 mg to 150 mg once daily.  Patient reports she felt it was helping until recently.  She would like a dosage increase.  She also wants help with her coping skills.  Depression screen [facility] 2/9 06/26/2020 09/21/2019  Decreased Interest 2 2  Down, Depressed, Hopeless 2 1  PHQ - 2 Score 4 3  Altered sleeping 3 2  Tired, decreased energy 3 3  Change in appetite 1 2  Feeling bad or failure about yourself  2 1  Trouble concentrating 1 2  Moving slowly or fidgety/restless 2 0  Suicidal thoughts 1 1  PHQ-9 Score 17 14  Difficult doing work/chores - Somewhat difficult   GAD 7 : Generalized Anxiety Score 06/26/2020 09/21/2019  Nervous, Anxious, on Edge 2 3  Control/stop worrying 2 1  Worry too much - different things 3 2  Trouble relaxing 2 1  Restless 2 3  Easily annoyed or irritable 3 2  Afraid - awful  might happen 1 1  Total GAD 7 Score 15 13  Anxiety Difficulty - Somewhat difficult    Patient 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  She was notified of the diagnosis of prediabetes.  They did not draw a vitamin D level as we had requested.  New complaints: Patient complains of left elbow pain x1 month.  She describes it as being stiff and achy.  She fell on it approximately 4 months ago.  She has been taking Advil 200 mg or Tylenol 500 mg once daily.  Patient also reports pain in her fingers/knuckles.  Social history:  Relevant past medical, surgical,  family and social history reviewed and updated as indicated. Interim medical history since our last visit reviewed.  Allergies and medications reviewed and updated.  DATA REVIEWED: CHART IN EPIC  ROS: Negative unless specifically indicated above in HPI.    Current Outpatient Medications:  .  albuterol (VENTOLIN HFA) 108 (90 Base) MCG/ACT inhaler, SMARTSIG:2 Puff(s) By Mouth Every 4 Hours PRN, Disp: , Rfl:  .  aspirin EC 81 MG tablet, Take 81 mg by mouth daily., Disp: , Rfl:  .  brimonidine (ALPHAGAN) 0.15 % ophthalmic solution, 1 drop 3 (three) times daily., Disp: , Rfl:  .  Calcium Carbonate-Vit D-Min (CALCIUM 1200 PO), Take by mouth., Disp: , Rfl:  .  cholecalciferol (VITAMIN D3) 25 MCG (1000 UT) tablet, Take 1,000 Units by mouth daily., Disp: , Rfl:  .  Clotrimazole (MYCELEX OTC EX), Apply topically., Disp: , Rfl:  .  ezetimibe (ZETIA) 10 MG tablet, Take 1 tablet (10 mg total) by mouth daily., Disp: 90 tablet, Rfl: 1 .  halobetasol (ULTRAVATE) 0.05 % cream, Apply topically 2 (two) times daily., Disp: , Rfl:  .  Homeopathic Products (ZICAM COLD REMEDY NA), Place into the nose., Disp: , Rfl:  .  minocycline (DYNACIN) 50 MG tablet, Take 50 mg by mouth 2 (two) times daily., Disp: , Rfl:  .  montelukast (SINGULAIR) 10 MG tablet, Take 1 tablet (10 mg total) by mouth at bedtime., Disp: 90 tablet, Rfl: 1 .  nystatin-triamcinolone (MYCOLOG II) cream, Apply 1 application topically 2 (two) times daily., Disp: , Rfl:  .  sertraline (ZOLOFT) 100 MG tablet, Take 1.5 tablets (150 mg total) by mouth daily., Disp: 135 tablet, Rfl: 1 .  simvastatin (ZOCOR) 40 MG tablet, Take 1 tablet (40 mg total) by mouth at bedtime., Disp: 30 tablet, Rfl: 0 .  vitamin E (VITAMIN E) 1000 UNIT capsule, Take 1,000 Units by mouth daily., Disp: , Rfl:    Allergies  Allergen Reactions  . Procaine Shortness Of Breath  . Niacin Hives   Past Medical History:  Diagnosis Date  . Allergy   . Bronchitis   . Glaucoma   .  History of rabies vaccination 07/2019   run in with raccoon  . Hyperlipidemia   . Prediabetes 06/18/2020  . Psoriasis     Past Surgical History:  Procedure Laterality Date  . ABDOMINAL HYSTERECTOMY  1998    Social History   Socioeconomic History  . Marital status: Married    Spouse name: Not on file  . Number of children: Not on file  . Years of education: Not on file  . Highest education level: Not on file  Occupational History  . Not on file  Tobacco Use  . Smoking status: Former Games developer  . Smokeless tobacco: Never Used  Vaping Use  . Vaping Use: Never used  Substance and Sexual Activity  . Alcohol use: Yes    Comment: occ  . Drug use: Never  . Sexual activity: Not Currently  Other Topics Concern  . Not on file  Social History Narrative  . Not on file   Social Determinants of Health   Financial Resource Strain:   . Difficulty of Paying Living Expenses: Not on file  Food Insecurity:   . Worried About Programme researcher, broadcasting/film/video in the Last Year: Not on file  . Ran Out of Food in the Last Year: Not on file  Transportation Needs:   . Lack of Transportation (Medical): Not on file  . Lack of Transportation (Non-Medical): Not on file  Physical Activity:   . Days of Exercise per Week: Not on file  . Minutes of Exercise per Session: Not on file  Stress:   . Feeling of Stress : Not on file  Social Connections:   . Frequency of Communication with Friends and Family: Not on file  . Frequency of Social Gatherings with Friends and Family: Not on file  . Attends Religious Services: Not on file  . Active Member of Clubs or Organizations: Not on file  . Attends Banker Meetings: Not on file  . Marital Status: Not on file  Intimate Partner Violence:   . Fear of Current or Ex-Partner: Not on file  . Emotionally Abused: Not on file  . Physically Abused: Not on file  . Sexually Abused: Not on file        Objective:    BP (!) 151/87   Pulse 69   Temp 97.7 F  (36.5 C) (Temporal)   Ht 5' 4.5" (1.638 m)   Wt 253 lb 12.8 oz (115.1 kg)   SpO2 93%   BMI 42.89 kg/m   Wt Readings from Last 3 Encounters:  06/26/20 253 lb 12.8 oz (115.1 kg)  09/21/19 255 lb 6.4 oz (115.8 kg)    Physical Exam Vitals reviewed.  Constitutional:      General: She is not in acute distress.    Appearance: Normal appearance. She is morbidly obese. She is not ill-appearing, toxic-appearing or diaphoretic.  HENT:     Head: Normocephalic and atraumatic.  Eyes:     General: No scleral icterus.       Right eye: No discharge.        Left eye: No discharge.     Conjunctiva/sclera: Conjunctivae normal.  Cardiovascular:     Rate and Rhythm: Normal rate and regular rhythm.     Heart sounds: Normal heart sounds. No murmur heard.  No friction rub. No gallop.   Pulmonary:     Effort: Pulmonary effort is normal. No respiratory distress.     Breath sounds: Normal breath sounds. No stridor. No wheezing, rhonchi or rales.  Musculoskeletal:        General: Normal range of motion.     Left elbow: No swelling, deformity, effusion or lacerations. Normal range of motion. Tenderness present in lateral epicondyle.     Cervical back: Normal range of motion.  Skin:    General: Skin is warm and dry.     Capillary Refill: Capillary refill takes less than 2 seconds.  Neurological:     General: No focal deficit present.     Mental Status: She is alert and oriented to person, place, and time. Mental status is at baseline.  Psychiatric:        Mood and Affect: Mood normal.  Behavior: Behavior normal.        Thought Content: Thought content normal.        Judgment: Judgment normal.     No results found for: TSH Lab Results  Component Value Date   WBC 7.6 06/18/2020   HGB 13.2 06/18/2020   HCT 39 06/18/2020   Lab Results  Component Value Date   NA 141 06/18/2020   K 4.3 06/18/2020   CO2 23 (A) 06/18/2020   BUN 13 06/18/2020   CREATININE 0.7 06/18/2020   ALKPHOS 94  06/18/2020   AST 19 06/18/2020   ALT 25 06/18/2020   ALBUMIN 4.6 06/18/2020   CALCIUM 9.3 06/18/2020   Lab Results  Component Value Date   CHOL 225 (A) 06/18/2020   Lab Results  Component Value Date   HDL 51 06/18/2020   Lab Results  Component Value Date   LDLCALC 147 06/18/2020   Lab Results  Component Value Date   TRIG 149 06/18/2020   No results found for: [facility] Corporation[facility]OLHDL Lab Results  Component Value Date   HGBA1C 6.0 06/18/2020

## 2020-06-26 NOTE — Patient Instructions (Signed)
Try to obtain colonoscopy results or have them sent to [place].   Go get your shingles vaccine.  Advil 400-600 mg every 6-8 hours as needed for pain.

## 2020-07-07 ENCOUNTER — Encounter: Payer: Self-pay | Admitting: Family Medicine

## 2020-08-06 ENCOUNTER — Encounter: Payer: Self-pay | Admitting: Nurse Practitioner

## 2020-08-06 ENCOUNTER — Other Ambulatory Visit: Payer: Self-pay

## 2020-08-06 ENCOUNTER — Ambulatory Visit (INDEPENDENT_AMBULATORY_CARE_PROVIDER_SITE_OTHER): Payer: Commercial Managed Care - PPO | Admitting: Nurse Practitioner

## 2020-08-06 VITALS — BP 148/78 | HR 77 | Temp 97.0°F | Ht 64.5 in | Wt 258.0 lb

## 2020-08-06 DIAGNOSIS — M25522 Pain in left elbow: Secondary | ICD-10-CM | POA: Diagnosis not present

## 2020-08-06 DIAGNOSIS — F339 Major depressive disorder, recurrent, unspecified: Secondary | ICD-10-CM

## 2020-08-06 DIAGNOSIS — F411 Generalized anxiety disorder: Secondary | ICD-10-CM | POA: Diagnosis not present

## 2020-08-06 MED ORDER — IBUPROFEN 600 MG PO TABS: 600.0000 mg | ORAL_TABLET | Freq: Three times a day (TID) | ORAL | 0 refills | Status: DC | PRN

## 2020-08-06 MED ORDER — DICLOFENAC SODIUM 1 % EX GEL: 2.0000 g | Freq: Four times a day (QID) | CUTANEOUS | 2 refills | Status: DC

## 2020-08-06 NOTE — Patient Instructions (Signed)
Patient depression and anxiety improved on current medication no changes necessary. Follow-up in 3 months.  Living With Depression Everyone experiences occasional disappointment, sadness, and loss in their lives. When you are feeling down, blue, or sad for at least 2 weeks in a row, it may mean that you have depression. Depression can affect your thoughts and feelings, relationships, daily activities, and physical health. It is caused by changes in the way your brain functions. If you receive a diagnosis of depression, your health care provider will tell you which type of depression you have and what treatment options are available to you. If you are living with depression, there are ways to help you recover from it and also ways to prevent it from coming back. How to cope with lifestyle changes Coping with stress     Stress is your body's reaction to life changes and events, both good and bad. Stressful situations may include:  Getting married.  The death of a spouse.  Losing a job.  Retiring.  Having a baby. Stress can last just a few hours or it can be ongoing. Stress can play a major role in depression, so it is important to learn both how to cope with stress and how to think about it differently. Talk with your health care provider or a counselor if you would like to learn more about stress reduction. He or she may suggest some stress reduction techniques, such as:  Music therapy. This can include creating music or listening to music. Choose music that you enjoy and that inspires you.  Mindfulness-based meditation. This kind of meditation can be done while sitting or walking. It involves being aware of your normal breaths, rather than trying to control your breathing.  Centering prayer. This is a kind of meditation that involves focusing on a spiritual word or phrase. Choose a word, phrase, or sacred image that is meaningful to you and that brings you peace.  Deep breathing. To  do this, expand your stomach and inhale slowly through your nose. Hold your breath for 3-5 seconds, then exhale slowly, allowing your stomach muscles to relax.  Muscle relaxation. This involves intentionally tensing muscles then relaxing them. Choose a stress reduction technique that fits your lifestyle and personality. Stress reduction techniques take time and practice to develop. Set aside 5-15 minutes a day to do them. Therapists can offer training in these techniques. The training may be covered by some insurance plans. Other things you can do to manage stress include:  Keeping a stress diary. This can help you learn what triggers your stress and ways to control your response.  Understanding what your limits are and saying no to requests or events that lead to a schedule that is too full.  Thinking about how you respond to certain situations. You may not be able to control everything, but you can control how you react.  Adding humor to your life by watching funny films or TV shows.  Making time for activities that help you relax and not feeling guilty about spending your time this way.  Medicines Your health care provider may suggest certain medicines if he or she feels that they will help improve your condition. Avoid using alcohol and other substances that may prevent your medicines from working properly (may interact). It is also important to:  Talk with your pharmacist or health care provider about all the medicines that you take, their possible side effects, and what medicines are safe to take together.  Make it  your goal to take part in all treatment decisions (shared decision-making). This includes giving input on the side effects of medicines. It is best if shared decision-making with your health care provider is part of your total treatment plan. If your health care provider prescribes a medicine, you may not notice the full benefits of it for 4-8 weeks. Most people who are treated  for depression need to be on medicine for at least 6-12 months after they feel better. If you are taking medicines as part of your treatment, do not stop taking medicines without first talking to your health care provider. You may need to have the medicine slowly decreased (tapered) over time to decrease the risk of harmful side effects. Relationships Your health care provider may suggest family therapy along with individual therapy and drug therapy. While there may not be family problems that are causing you to feel depressed, it is still important to make sure your family learns as much as they can about your mental health. Having your family's support can help make your treatment successful. How to recognize changes in your condition Everyone has a different response to treatment for depression. Recovery from major depression happens when you have not had signs of major depression for two months. This may mean that you will start to:  Have more interest in doing activities.  Feel less hopeless than you did 2 months ago.  Have more energy.  Overeat less often, or have better or improving appetite.  Have better concentration. Your health care provider will work with you to decide the next steps in your recovery. It is also important to recognize when your condition is getting worse. Watch for these signs:  Having fatigue or low energy.  Eating too much or too little.  Sleeping too much or too little.  Feeling restless, agitated, or hopeless.  Having trouble concentrating or making decisions.  Having unexplained physical complaints.  Feeling irritable, angry, or aggressive. Get help as soon as you or your family members notice these symptoms coming back. How to get support and help from others How to talk with friends and family members about your condition  Talking to friends and family members about your condition can provide you with one way to get support and guidance. Reach out  to trusted friends or family members, explain your symptoms to them, and let them know that you are working with a health care provider to treat your depression. Financial resources Not all insurance plans cover mental health care, so it is important to check with your insurance carrier. If paying for co-pays or counseling services is a problem, search for a local or county mental health care center. They may be able to offer public mental health care services at low or no cost when you are not able to see a private health care provider. If you are taking medicine for depression, you may be able to get the generic form, which may be less expensive. Some makers of prescription medicines also offer help to patients who cannot afford the medicines they need. Follow these instructions at home:   Get the right amount and quality of sleep.  Cut down on using caffeine, tobacco, alcohol, and other potentially harmful substances.  Try to exercise, such as walking or lifting small weights.  Take over-the-counter and prescription medicines only as told by your health care provider.  Eat a healthy diet that includes plenty of vegetables, fruits, whole grains, low-fat dairy products, and lean protein. Do  not eat a lot of foods that are high in solid fats, added sugars, or salt.  Keep all follow-up visits as told by your health care provider. This is important. Contact a health care provider if:  You stop taking your antidepressant medicines, and you have any of these symptoms: ? Nausea. ? Headache. ? Feeling lightheaded. ? Chills and body aches. ? Not being able to sleep (insomnia).  You or your friends and family think your depression is getting worse. Get help right away if:  You have thoughts of hurting yourself or others. If you ever feel like you may hurt yourself or others, or have thoughts about taking your own life, get help right away. You can go to your nearest emergency department or  call:  Your local emergency services (911 in the [place]  A suicide crisis helpline, such as the [facility] at 813 367 4475. This is open 24-hours a day. Summary  If you are living with depression, there are ways to help you recover from it and also ways to prevent it from coming back.  Work with your health care team to create a management plan that includes counseling, stress management techniques, and healthy lifestyle habits. This information is not intended to replace advice given to you by your health care provider. Make sure you discuss any questions you have with your health care provider. Document Revised: 01/13/2019 Document Reviewed: 08/24/2016 Elsevier Patient Education  [place].

## 2020-08-06 NOTE — Assessment & Plan Note (Signed)
Depression well controlled on current medication no changes to medication dose.

## 2020-08-06 NOTE — Assessment & Plan Note (Signed)
Left elbow pain not well managed. This is not new for patient. Provided education with printed handouts given. Ibuprofen as needed, ice application, rest elbow, Voltaren topical gel.  Follow-up with worsening or unresolved symptoms.  Rx sent to pharmacy.

## 2020-08-06 NOTE — Assessment & Plan Note (Signed)
Well-managed: The patient no changes necessary. Patient is currently on Zoloft 100 mg , 2 tablets daily.

## 2020-08-06 NOTE — Progress Notes (Signed)
Established Patient Office Visit  Subjective:  [name], female    DOB: 12-27-1955  Age: 64 y.o. MRN: [id number]  CC:  Chief Complaint  Patient presents with  . Anxiety    6 week follow up  . Depression    HPI Jasmine Kim presents for Anxiety Presents for follow-up visit. Symptoms include nervous/anxious behavior and shortness of breath. Patient reports no chest pain, compulsions, feeling of choking, muscle tension or suicidal ideas. Symptoms occur most days. The severity of symptoms is mild. The quality of sleep is fair.   Her past medical history is significant for depression. There is no history of suicide attempts. Compliance with medications is 76-100%.  Depression      The patient presents with depression.  This is a recurrent problem.  The current episode started more than 1 year ago.   The onset quality is gradual.   The problem occurs every several days.  The problem has been gradually improving since onset.  Associated symptoms include body aches and myalgias.  Associated symptoms include no decreased interest and no suicidal ideas.     The symptoms are aggravated by nothing.  Past treatments include SSRIs - Selective serotonin reuptake inhibitors.  Compliance with treatment is good.  Past medical history includes anxiety and depression.     Pertinent negatives include no suicide attempts.  Left elbow pain This is new for patient in the last few weeks. Patient reports pain has been ongoing after COVID-19 infection. Patient has used steroids in the past with mild relief. Patient reports taking and unrelenting pain left elbow radiating down fingers.   Past Medical History:  Diagnosis Date  . Allergy   . Bronchitis   . Glaucoma   . History of rabies vaccination 07/2019   run in with raccoon  . Hyperlipidemia   . Prediabetes 06/18/2020  . Psoriasis     Past Surgical History:  Procedure Laterality Date  . ABDOMINAL HYSTERECTOMY  1998    Family  History  Problem Relation Age of Onset  . Uterine cancer Mother   . Pancreatic cancer Father   . Cancer Brother        Unknown type  . Heart disease Sister   . Colon cancer Sister        45s  . Diabetes Maternal Grandmother   . Hypertension Maternal Grandmother   . Arthritis Maternal Grandmother     Social History   Socioeconomic History  . Marital status: Married    Spouse name: Not on file  . Number of children: Not on file  . Years of education: Not on file  . Highest education level: Not on file  Occupational History  . Not on file  Tobacco Use  . Smoking status: Former Games developer  . Smokeless tobacco: Never Used  Vaping Use  . Vaping Use: Never used  Substance and Sexual Activity  . Alcohol use: Yes    Comment: occ  . Drug use: Never  . Sexual activity: Not Currently  Other Topics Concern  . Not on file  Social History Narrative  . Not on file   Social Determinants of Health   Financial Resource Strain:   . Difficulty of Paying Living Expenses: Not on file  Food Insecurity:   . Worried About Programme researcher, broadcasting/film/video in the Last Year: Not on file  . Ran Out of Food in the Last Year: Not on file  Transportation Needs:   . Lack of Transportation (  Medical): Not on file  . Lack of Transportation (Non-Medical): Not on file  Physical Activity:   . Days of Exercise per Week: Not on file  . Minutes of Exercise per Session: Not on file  Stress:   . Feeling of Stress : Not on file  Social Connections:   . Frequency of Communication with Friends and Family: Not on file  . Frequency of Social Gatherings with Friends and Family: Not on file  . Attends Religious Services: Not on file  . Active Member of Clubs or Organizations: Not on file  . Attends Banker Meetings: Not on file  . Marital Status: Not on file  Intimate Partner Violence:   . Fear of Current or Ex-Partner: Not on file  . Emotionally Abused: Not on file  . Physically Abused: Not on file  .  Sexually Abused: Not on file    Outpatient Medications Prior to Visit  Medication Sig Dispense Refill  . albuterol (VENTOLIN HFA) 108 (90 Base) MCG/ACT inhaler SMARTSIG:2 Puff(s) By Mouth Every 4 Hours PRN 18 g 2  . aspirin EC 81 MG tablet Take 81 mg by mouth daily.    . brimonidine (ALPHAGAN) 0.15 % ophthalmic solution 1 drop 3 (three) times daily.    . Calcium Carbonate-Vit D-Min (CALCIUM 1200 PO) Take by mouth.    . cholecalciferol (VITAMIN D3) 25 MCG (1000 UT) tablet Take 1,000 Units by mouth daily.    . Clotrimazole (MYCELEX OTC EX) Apply topically.    Marland Kitchen ezetimibe (ZETIA) 10 MG tablet Take 1 tablet (10 mg total) by mouth daily. 90 tablet 1  . halobetasol (ULTRAVATE) 0.05 % cream Apply topically 2 (two) times daily.    . Homeopathic Products (ZICAM COLD REMEDY NA) Place into the nose.    . minocycline (DYNACIN) 50 MG tablet Take 50 mg by mouth 2 (two) times daily.    . montelukast (SINGULAIR) 10 MG tablet Take 1 tablet (10 mg total) by mouth at bedtime. 90 tablet 1  . nystatin-triamcinolone (MYCOLOG II) cream Apply 1 application topically 2 (two) times daily.    . sertraline (ZOLOFT) 100 MG tablet Take 2 tablets (200 mg total) by mouth daily. 180 tablet 1  . simvastatin (ZOCOR) 40 MG tablet Take 1 tablet (40 mg total) by mouth at bedtime. 30 tablet 0  . vitamin E (VITAMIN E) 1000 UNIT capsule Take 1,000 Units by mouth daily.    . predniSONE (STERAPRED UNI-PAK 21 TAB) 10 MG (21) TBPK tablet As directed x 6 days 21 tablet 0   No facility-administered medications prior to visit.    Allergies  Allergen Reactions  . Procaine Shortness Of Breath  . Niacin Hives    ROS Review of Systems  Respiratory: Positive for shortness of breath.   Cardiovascular: Negative for chest pain.  Musculoskeletal: Positive for myalgias.  Psychiatric/Behavioral: Positive for depression. Negative for self-injury and suicidal ideas. The patient is nervous/anxious.   All other systems reviewed and are  negative.     Objective:    Physical Exam Vitals reviewed.  Constitutional:      Appearance: Normal appearance.  HENT:     Head: Normocephalic.  Cardiovascular:     Rate and Rhythm: Normal rate and regular rhythm.     Pulses: Normal pulses.     Heart sounds: Normal heart sounds.  Pulmonary:     Effort: Pulmonary effort is normal.     Breath sounds: Normal breath sounds.  Abdominal:     General: Bowel sounds  are normal.  Musculoskeletal:        General: Normal range of motion.  Skin:    General: Skin is warm.  Neurological:     Mental Status: She is alert and oriented to person, place, and time.  Psychiatric:     Comments: Positive for depression     BP (!) 148/78   Pulse 77   Temp (!) 97 F (36.1 C) (Temporal)   Ht 5' 4.5" (1.638 m)   Wt 258 lb (117 kg)   SpO2 93%   BMI 43.60 kg/m  Wt Readings from Last 3 Encounters:  06/26/20 253 lb 12.8 oz (115.1 kg)  09/21/19 255 lb 6.4 oz (115.8 kg)     There are no preventive care reminders to display for this patient.  There are no preventive care reminders to display for this patient.  No results found for: TSH Lab Results  Component Value Date   WBC 7.6 06/18/2020   HGB 13.2 06/18/2020   HCT 39 06/18/2020   Lab Results  Component Value Date   NA 141 06/18/2020   K 4.3 06/18/2020   CO2 23 (A) 06/18/2020   BUN 13 06/18/2020   CREATININE 0.7 06/18/2020   ALKPHOS 94 06/18/2020   AST 19 06/18/2020   ALT 25 06/18/2020   ALBUMIN 4.6 06/18/2020   CALCIUM 9.3 06/18/2020   Lab Results  Component Value Date   CHOL 225 (A) 06/18/2020   Lab Results  Component Value Date   HDL 51 06/18/2020   Lab Results  Component Value Date   LDLCALC 147 06/18/2020   Lab Results  Component Value Date   TRIG 149 06/18/2020   No results found for: [facility] Lab Results  Component Value Date   HGBA1C 6.0 06/18/2020      Assessment & Plan:   Problem List Items Addressed This Visit      Other   Anxiety,  generalized - Primary    Well-managed: The patient no changes necessary. Patient is currently on Zoloft 100 mg , 2 tablets daily.      Left elbow pain    Left elbow pain not well managed. This is not new for patient. Provided education with printed handouts given. Ibuprofen as needed, ice application, rest elbow, Voltaren topical gel.  Follow-up with worsening or unresolved symptoms.  Rx sent to pharmacy.      Depression, recurrent (HCC)    Depression well controlled on current medication no changes to medication dose.         No orders of the defined types were placed in this encounter.   Follow-up: Return in about 3 months (around 11/06/2020).    Daryll Drown, NP

## 2020-11-06 ENCOUNTER — Encounter: Payer: Self-pay | Admitting: Family Medicine

## 2020-11-06 ENCOUNTER — Other Ambulatory Visit: Payer: Self-pay

## 2020-11-06 ENCOUNTER — Ambulatory Visit (INDEPENDENT_AMBULATORY_CARE_PROVIDER_SITE_OTHER): Payer: Commercial Managed Care - PPO | Admitting: Family Medicine

## 2020-11-06 VITALS — BP 135/82 | HR 82 | Temp 97.8°F | Ht 64.5 in | Wt 257.4 lb

## 2020-11-06 DIAGNOSIS — E785 Hyperlipidemia, unspecified: Secondary | ICD-10-CM | POA: Diagnosis not present

## 2020-11-06 DIAGNOSIS — F411 Generalized anxiety disorder: Secondary | ICD-10-CM

## 2020-11-06 DIAGNOSIS — F339 Major depressive disorder, recurrent, unspecified: Secondary | ICD-10-CM

## 2020-11-06 DIAGNOSIS — Z23 Encounter for immunization: Secondary | ICD-10-CM | POA: Diagnosis not present

## 2020-11-06 DIAGNOSIS — E559 Vitamin D deficiency, unspecified: Secondary | ICD-10-CM | POA: Diagnosis not present

## 2020-11-06 DIAGNOSIS — R7303 Prediabetes: Secondary | ICD-10-CM

## 2020-11-06 DIAGNOSIS — Z0000 Encounter for general adult medical examination without abnormal findings: Secondary | ICD-10-CM

## 2020-11-06 MED ORDER — SIMVASTATIN 40 MG PO TABS: 40.0000 mg | ORAL_TABLET | Freq: Every day | ORAL | 1 refills | Status: DC

## 2020-11-06 MED ORDER — TRAZODONE HCL 50 MG PO TABS: 50.0000 mg | ORAL_TABLET | Freq: Every evening | ORAL | 2 refills | Status: DC | PRN

## 2020-11-06 NOTE — Patient Instructions (Signed)
Managing Loss, Adult People experience loss in many different ways throughout their lives. Events such as moving, changing jobs, and losing friends can create a sense of loss. The loss may be as serious as a major health change, divorce, death of a pet, or death of a loved one. All of these types of loss are likely to create a physical and emotional reaction known as grief. Grief is the result of a major change or an absence of something or someone that you count on. Grief is a normal reaction to loss. A variety of factors can affect your grieving experience, including:  The nature of your loss.  Your relationship to what or whom you lost.  Your understanding of grief and how to manage it.  Your support system. How to manage lifestyle changes Keep to your normal routine as much as possible.  If you have trouble focusing or doing normal activities, it is acceptable to take some time away from your normal routine.  Spend time with friends and loved ones.  Eat a healthy diet, get plenty of sleep, and rest when you feel tired.   How to recognize changes  The way that you deal with your grief will affect your ability to function as you normally do. When grieving, you may experience these changes:  Numbness, shock, sadness, anxiety, anger, denial, and guilt.  Thoughts about death.  Unexpected crying.  A physical sensation of emptiness in your stomach.  Problems sleeping and eating.  Tiredness (fatigue).  Loss of interest in normal activities.  Dreaming about or imagining seeing the person who died.  A need to remember what or whom you lost.  Difficulty thinking about anything other than your loss for a period of time.  Relief. If you have been expecting the loss for a while, you may feel a sense of relief when it happens. Follow these instructions at home: Activity Express your feelings in healthy ways, such as:  Talking with others about your loss. It may be helpful to find  others who have had a similar loss, such as a support group.  Writing down your feelings in a journal.  Doing physical activities to release stress and emotional energy.  Doing creative activities like painting, sculpting, or playing or listening to music.  Practicing resilience. This is the ability to recover and adjust after facing challenges. Reading some resources that encourage resilience may help you to learn ways to practice those behaviors.   General instructions  Be patient with yourself and others. Allow the grieving process to happen, and remember that grieving takes time. ? It is likely that you may never feel completely done with some grief. You may find a way to move on while still cherishing memories and feelings about your loss. ? Accepting your loss is a process. It can take months or longer to adjust.  Keep all follow-up visits as told by your health care provider. This is important. Where to find support To get support for managing loss:  Ask your health care provider for help and recommendations, such as grief counseling or therapy.  Think about joining a support group for people who are managing a loss. Where to find more information You can find more information about managing loss from:  American Society of Clinical Oncology: www.cancer.net  American Psychological Association: www.apa.org Contact a health care provider if:  Your grief is extreme and keeps getting worse.  You have ongoing grief that does not improve.  Your body shows symptoms   of grief, such as illness.  You feel depressed, anxious, or lonely. Get help right away if:  You have thoughts about hurting yourself or others. If you ever feel like you may hurt yourself or others, or have thoughts about taking your own life, get help right away. You can go to your nearest emergency department or call:  Your local emergency services (911 in the [place]  A suicide crisis helpline, such as the  National Suicide Prevention Lifeline at 1-800-273-8255. This is open 24 hours a day. Summary  Grief is the result of a major change or an absence of someone or something that you count on. Grief is a normal reaction to loss.  The depth of grief and the period of recovery depend on the type of loss and your ability to adjust to the change and process your feelings.  Processing grief requires patience and a willingness to accept your feelings and talk about your loss with people who are supportive.  It is important to find resources that work for you and to realize that people experience grief differently. There is not one grieving process that works for everyone in the same way.  Be aware that when grief becomes extreme, it can lead to more severe issues like isolation, depression, anxiety, or suicidal thoughts. Talk with your health care provider if you have any of these issues. This information is not intended to replace advice given to you by your health care provider. Make sure you discuss any questions you have with your health care provider. Document Revised: 03/14/2020 Document Reviewed: 03/14/2020 Elsevier Patient Education  2021 [employer]  

## 2020-11-06 NOTE — Progress Notes (Signed)
Assessment & Plan:  1-2. Depression, recurrent (HCC)/Anxiety, generalized - Uncontrolled.  Continue sertraline 200 mg once daily.  Rx trazodone with instructions to start with 50 mg x 3 nights and that she may increase to 100 mg if needed.  Encouraged grief counseling, but patient declined at this time.  Education provided on managing loss. - traZODone (DESYREL) 50 MG tablet; Take 1-2 tablets (50-100 mg total) by mouth at bedtime as needed for sleep.  Dispense: 30 tablet; Refill: 2  3. Dyslipidemia - Uncontrolled per last labs.  Patient declines lab work today and states she will get it completed at work in the next month or two. - simvastatin (ZOCOR) 40 MG tablet; Take 1 tablet (40 mg total) by mouth at bedtime.  Dispense: 90 tablet; Refill: 1  4. Vitamin D deficiency - Patient declines lab work today and states she will get it completed at work in the next month or two.  5. Prediabetes - Patient declines lab work today and states she will get it completed at work in the next month or two.  6. Healthcare maintenance - Patient to get colonoscopy record.  7. Immunization due - PCV-13 given in office today.   Return in about 6 weeks (around 12/18/2020) for depression.  Deliah Boston, MSN, APRN, FNP-C [facility]  Subjective:    [name], female    DOB: Jun 08, 1956, 65 y.o.   MRN: [id number]  Patient Care Team: Gwenlyn Fudge, FNP as PCP - General (Family Medicine) Chalmers Guest, MD as Consulting Physician (Ophthalmology) Janalyn Harder, MD as Consulting Physician (Dermatology) Sandford Craze, DC as Consulting Physician (Chiropractic Medicine)   Chief Complaint:  Chief Complaint  Patient presents with  . Anxiety    3 month follow up. Patient states that her husband passed away in 2023/10/15.     HPI: Jasmine Kim is a 65 y.o. female presenting on 11/06/2020 for Anxiety (3 month follow up. Patient states that her husband passed away  in 10-15-2023. )  Anxiety/depression: Patient states she was doing well until her husband passed away in 10-15-23.  She is the one that found him and states that he had a heart attack.  She is having a hard time falling asleep at night.  Depression screen [facility] 2/9 08/06/2020 06/26/2020 2019/10/15  Decreased Interest 1 2 2   Down, Depressed, Hopeless 1 2 1   PHQ - 2 Score 2 4 3   Altered sleeping 2 3 2   Tired, decreased energy 2 3 3   Change in appetite 2 1 2   Feeling bad or failure about yourself  1 2 1   Trouble concentrating 1 1 2   Moving slowly or fidgety/restless 0 2 0  Suicidal thoughts 0 1 1  PHQ-9 Score 10 17 14   Difficult doing work/chores - - Somewhat difficult   GAD 7 : Generalized Anxiety Score 08/06/2020 06/26/2020 2019-10-15  Nervous, Anxious, on Edge 1 2 3   Control/stop worrying 1 2 1   Worry too much - different things 1 3 2   Trouble relaxing 0 2 1  Restless 1 2 3   Easily annoyed or irritable 1 3 2   Afraid - awful might happen 0 1 1  Total GAD 7 Score 5 15 13   Anxiety Difficulty - - Somewhat difficult    New complaints: None  Social history:  Relevant past medical, surgical, family and social history reviewed and updated as indicated. Interim medical history since our last visit reviewed.  Allergies and medications reviewed and updated.  DATA REVIEWED: CHART IN EPIC  ROS: Negative unless specifically indicated above in HPI.    Current Outpatient Medications:  .  albuterol (VENTOLIN HFA) 108 (90 Base) MCG/ACT inhaler, SMARTSIG:2 Puff(s) By Mouth Every 4 Hours PRN, Disp: 18 g, Rfl: 2 .  aspirin EC 81 MG tablet, Take 81 mg by mouth daily., Disp: , Rfl:  .  brimonidine (ALPHAGAN) 0.15 % ophthalmic solution, 1 drop 3 (three) times daily., Disp: , Rfl:  .  Calcium Carbonate-Vit D-Min (CALCIUM 1200 PO), Take by mouth., Disp: , Rfl:  .  cholecalciferol (VITAMIN D3) 25 MCG (1000 UT) tablet, Take 1,000 Units by mouth daily., Disp: , Rfl:  .  Clotrimazole (MYCELEX OTC EX),  Apply topically., Disp: , Rfl:  .  diclofenac Sodium (VOLTAREN) 1 % GEL, Apply 2 g topically 4 (four) times daily., Disp: 50 g, Rfl: 2 .  ezetimibe (ZETIA) 10 MG tablet, Take 1 tablet (10 mg total) by mouth daily., Disp: 90 tablet, Rfl: 1 .  halobetasol (ULTRAVATE) 0.05 % cream, Apply topically 2 (two) times daily., Disp: , Rfl:  .  Homeopathic Products (ZICAM COLD REMEDY NA), Place into the nose., Disp: , Rfl:  .  ibuprofen (ADVIL) 600 MG tablet, Take 1 tablet (600 mg total) by mouth every 8 (eight) hours as needed., Disp: 30 tablet, Rfl: 0 .  minocycline (DYNACIN) 50 MG tablet, Take 50 mg by mouth 2 (two) times daily., Disp: , Rfl:  .  montelukast (SINGULAIR) 10 MG tablet, Take 1 tablet (10 mg total) by mouth at bedtime., Disp: 90 tablet, Rfl: 1 .  nystatin-triamcinolone (MYCOLOG II) cream, Apply 1 application topically 2 (two) times daily., Disp: , Rfl:  .  sertraline (ZOLOFT) 100 MG tablet, Take 2 tablets (200 mg total) by mouth daily., Disp: 180 tablet, Rfl: 1 .  simvastatin (ZOCOR) 40 MG tablet, Take 1 tablet (40 mg total) by mouth at bedtime., Disp: 30 tablet, Rfl: 0 .  vitamin E 1000 UNIT capsule, Take 1,000 Units by mouth daily., Disp: , Rfl:    Allergies  Allergen Reactions  . Procaine Shortness Of Breath  . Niacin Hives   Past Medical History:  Diagnosis Date  . Allergy   . Bronchitis   . Glaucoma   . History of rabies vaccination 07/2019   run in with raccoon  . Hyperlipidemia   . Prediabetes 06/18/2020  . Psoriasis     Past Surgical History:  Procedure Laterality Date  . ABDOMINAL HYSTERECTOMY  1998    Social History   Socioeconomic History  . Marital status: Married    Spouse name: Not on file  . Number of children: Not on file  . Years of education: Not on file  . Highest education level: Not on file  Occupational History  . Not on file  Tobacco Use  . Smoking status: Former Games developer  . Smokeless tobacco: Never Used  Vaping Use  . Vaping Use: Never used   Substance and Sexual Activity  . Alcohol use: Yes    Comment: occ  . Drug use: Never  . Sexual activity: Not Currently  Other Topics Concern  . Not on file  Social History Narrative  . Not on file   Social Determinants of Health   Financial Resource Strain: Not on file  Food Insecurity: Not on file  Transportation Needs: Not on file  Physical Activity: Not on file  Stress: Not on file  Social Connections: Not on file  Intimate Partner Violence: Not on file  Objective:    BP 135/82   Pulse 82   Temp 97.8 F (36.6 C) (Temporal)   Ht 5' 4.5" (1.638 m)   Wt 257 lb 6.4 oz (116.8 kg)   SpO2 93%   BMI 43.50 kg/m   Wt Readings from Last 3 Encounters:  11/06/20 257 lb 6.4 oz (116.8 kg)  08/06/20 258 lb (117 kg)  06/26/20 253 lb 12.8 oz (115.1 kg)    Physical Exam Vitals reviewed.  Constitutional:      General: She is not in acute distress.    Appearance: Normal appearance. She is morbidly obese. She is not ill-appearing, toxic-appearing or diaphoretic.  HENT:     Head: Normocephalic and atraumatic.  Eyes:     General: No scleral icterus.       Right eye: No discharge.        Left eye: No discharge.     Conjunctiva/sclera: Conjunctivae normal.  Cardiovascular:     Rate and Rhythm: Normal rate.  Pulmonary:     Effort: Pulmonary effort is normal. No respiratory distress.  Musculoskeletal:        General: Normal range of motion.     Cervical back: Normal range of motion.  Skin:    General: Skin is warm and dry.     Capillary Refill: Capillary refill takes less than 2 seconds.  Neurological:     General: No focal deficit present.     Mental Status: She is alert and oriented to person, place, and time. Mental status is at baseline.  Psychiatric:        Mood and Affect: Mood normal.        Behavior: Behavior normal.        Thought Content: Thought content normal.        Judgment: Judgment normal.     No results found for: TSH Lab Results  Component  Value Date   WBC 7.6 06/18/2020   HGB 13.2 06/18/2020   HCT 39 06/18/2020   Lab Results  Component Value Date   NA 141 06/18/2020   K 4.3 06/18/2020   CO2 23 (A) 06/18/2020   BUN 13 06/18/2020   CREATININE 0.7 06/18/2020   ALKPHOS 94 06/18/2020   AST 19 06/18/2020   ALT 25 06/18/2020   ALBUMIN 4.6 06/18/2020   CALCIUM 9.3 06/18/2020   Lab Results  Component Value Date   CHOL 225 (A) 06/18/2020   Lab Results  Component Value Date   HDL 51 06/18/2020   Lab Results  Component Value Date   LDLCALC 147 06/18/2020   Lab Results  Component Value Date   TRIG 149 06/18/2020   No results found for: [facility] Lab Results  Component Value Date   HGBA1C 6.0 06/18/2020

## 2020-11-06 NOTE — Addendum Note (Signed)
Addended by: Angela Adam on: 11/06/2020 03:42 PM   Modules accepted: Orders

## 2020-11-27 LAB — BASIC METABOLIC PANEL
BUN: 10 (ref 4–21)
CO2: 22 (ref 13–22)
Chloride: 102 (ref 99–108)
Creatinine: 0.7 (ref 0.5–1.1)
Glucose: 115
Potassium: 4.8 (ref 3.4–5.3)
Sodium: 139 (ref 137–147)

## 2020-11-27 LAB — CBC AND DIFFERENTIAL
HCT: 39 (ref 36–46)
Hemoglobin: 12.7 (ref 12.0–16.0)
WBC: 6.4

## 2020-11-27 LAB — COMPREHENSIVE METABOLIC PANEL
Albumin: 4.4 (ref 3.5–5.0)
Calcium: 9 (ref 8.7–10.7)
GFR calc non Af Amer: 87
Globulin: 2.5

## 2020-11-27 LAB — LIPID PANEL
Cholesterol: 171 (ref 0–200)
HDL: 49 (ref 35–70)
LDL Cholesterol: 98
Triglycerides: 137 (ref 40–160)

## 2020-11-27 LAB — HEPATIC FUNCTION PANEL
ALT: 15 (ref 7–35)
AST: 19 (ref 13–35)
Alkaline Phosphatase: 76 (ref 25–125)
Bilirubin, Total: 0.2

## 2020-11-27 LAB — CBC: RBC: 4.29 (ref 3.87–5.11)

## 2020-11-27 LAB — TSH: TSH: 2.82 (ref 0.41–5.90)

## 2020-11-27 LAB — HEMOGLOBIN A1C: Hemoglobin A1C: 5.9

## 2020-11-27 LAB — VITAMIN D 25 HYDROXY (VIT D DEFICIENCY, FRACTURES): Vit D, 25-Hydroxy: 29.5

## 2020-11-29 ENCOUNTER — Encounter: Payer: Self-pay | Admitting: Family Medicine

## 2020-12-19 ENCOUNTER — Encounter: Payer: Self-pay | Admitting: Family Medicine

## 2020-12-19 ENCOUNTER — Other Ambulatory Visit: Payer: Self-pay

## 2020-12-19 ENCOUNTER — Ambulatory Visit (INDEPENDENT_AMBULATORY_CARE_PROVIDER_SITE_OTHER): Payer: Commercial Managed Care - PPO | Admitting: Family Medicine

## 2020-12-19 VITALS — BP 137/86 | HR 90 | Temp 96.5°F | Ht 64.5 in | Wt 254.6 lb

## 2020-12-19 DIAGNOSIS — F339 Major depressive disorder, recurrent, unspecified: Secondary | ICD-10-CM | POA: Diagnosis not present

## 2020-12-19 DIAGNOSIS — E559 Vitamin D deficiency, unspecified: Secondary | ICD-10-CM

## 2020-12-19 DIAGNOSIS — J302 Other seasonal allergic rhinitis: Secondary | ICD-10-CM | POA: Diagnosis not present

## 2020-12-19 DIAGNOSIS — E785 Hyperlipidemia, unspecified: Secondary | ICD-10-CM

## 2020-12-19 DIAGNOSIS — F411 Generalized anxiety disorder: Secondary | ICD-10-CM

## 2020-12-19 MED ORDER — MONTELUKAST SODIUM 10 MG PO TABS: 10.0000 mg | ORAL_TABLET | Freq: Every day | ORAL | 1 refills | Status: DC

## 2020-12-19 MED ORDER — SERTRALINE HCL 100 MG PO TABS: 200.0000 mg | ORAL_TABLET | Freq: Every day | ORAL | 1 refills | Status: DC

## 2020-12-19 MED ORDER — EZETIMIBE 10 MG PO TABS: 10.0000 mg | ORAL_TABLET | Freq: Every day | ORAL | 1 refills | Status: DC

## 2020-12-19 MED ORDER — TRAZODONE HCL 50 MG PO TABS: 50.0000 mg | ORAL_TABLET | Freq: Every day | ORAL | 1 refills | Status: DC

## 2020-12-19 NOTE — Progress Notes (Signed)
Assessment & Plan:  1. Depression, recurrent (HCC) Improving. Encouraged patient to take Trazodone nightly to help with depression.  - traZODone (DESYREL) 50 MG tablet; Take 1 tablet (50 mg total) by mouth at bedtime.  Dispense: 90 tablet; Refill: 1  2. Anxiety, generalized Well controlled on current regimen.  - sertraline (ZOLOFT) 100 MG tablet; Take 2 tablets (200 mg total) by mouth daily.  Dispense: 180 tablet; Refill: 1  3. Vitamin D deficiency Increase vitamin D from 1,000 units to 2,000 units once daily.    Return in about 4 months (around 04/20/2021) for follow-up of chronic medication conditions.  Deliah Boston, MSN, APRN, FNP-C [facility]  Subjective:    [name], female    DOB: 1955-12-08, 65 y.o.   MRN: [id number]  Patient Care Team: Gwenlyn Fudge, FNP as PCP - General (Family Medicine) Chalmers Guest, MD as Consulting Physician (Ophthalmology) Janalyn Harder, MD as Consulting Physician (Dermatology) Sandford Craze, DC as Consulting Physician (Chiropractic Medicine)   Chief Complaint:  Chief Complaint  Patient presents with  . Depression    6 week follow up    HPI: Jasmine Kim is a 65 y.o. female presenting on 12/19/2020 for Depression (6 week follow up)  Patient was given a prescription for Trazodone six weeks ago. She reports she has only taken it 3-4 times since our last visit. She reports she figured out she has to take it an hour before going to bed. She feels her depression is improving somewhat as she has gotten through a lot of papers required for her husbands death and she is getting her house renovations started.   Depression screen [facility] 2/9 12/19/2020 11/06/2020 08/06/2020  Decreased Interest 1 2 1   Down, Depressed, Hopeless 1 2 1   PHQ - 2 Score 2 4 2   Altered sleeping 1 2 2   Tired, decreased energy 1 2 2   Change in appetite 2 1 2   Feeling bad or failure about yourself  0 0 1  Trouble concentrating 0 0 1   Moving slowly or fidgety/restless 0 1 0  Suicidal thoughts 0 0 0  PHQ-9 Score 6 10 10   Difficult doing work/chores Somewhat difficult Somewhat difficult -   GAD 7 : Generalized Anxiety Score 12/19/2020 11/06/2020 08/06/2020 06/26/2020  Nervous, Anxious, on Edge 1 2 1 2   Control/stop worrying 0 1 1 2   Worry too much - different things 0 1 1 3   Trouble relaxing 0 0 0 2  Restless 1 1 1 2   Easily annoyed or irritable 0 2 1 3   Afraid - awful might happen 0 0 0 1  Total GAD 7 Score 2 7 5 15   Anxiety Difficulty Not difficult at all Not difficult at all - -   New complaints: None  Social history:  Relevant past medical, surgical, family and social history reviewed and updated as indicated. Interim medical history since our last visit reviewed.  Allergies and medications reviewed and updated.  DATA REVIEWED: CHART IN EPIC  ROS: Negative unless specifically indicated above in HPI.    Current Outpatient Medications:  .  albuterol (VENTOLIN HFA) 108 (90 Base) MCG/ACT inhaler, SMARTSIG:2 Puff(s) By Mouth Every 4 Hours PRN, Disp: 18 g, Rfl: 2 .  aspirin EC 81 MG tablet, Take 81 mg by mouth daily., Disp: , Rfl:  .  brimonidine (ALPHAGAN) 0.15 % ophthalmic solution, 1 drop 3 (three) times daily., Disp: , Rfl:  .  Calcium Carbonate-Vit D-Min (CALCIUM 1200 PO),  Take by mouth., Disp: , Rfl:  .  cholecalciferol (VITAMIN D3) 25 MCG (1000 UT) tablet, Take 1,000 Units by mouth daily., Disp: , Rfl:  .  Clotrimazole (MYCELEX OTC EX), Apply topically., Disp: , Rfl:  .  diclofenac Sodium (VOLTAREN) 1 % GEL, Apply 2 g topically 4 (four) times daily., Disp: 50 g, Rfl: 2 .  ezetimibe (ZETIA) 10 MG tablet, Take 1 tablet (10 mg total) by mouth daily., Disp: 90 tablet, Rfl: 1 .  halobetasol (ULTRAVATE) 0.05 % cream, Apply topically 2 (two) times daily., Disp: , Rfl:  .  Homeopathic Products (ZICAM COLD REMEDY NA), Place into the nose., Disp: , Rfl:  .  ibuprofen (ADVIL) 600 MG tablet, Take 1 tablet (600 mg  total) by mouth every 8 (eight) hours as needed., Disp: 30 tablet, Rfl: 0 .  minocycline (DYNACIN) 50 MG tablet, Take 50 mg by mouth 2 (two) times daily., Disp: , Rfl:  .  montelukast (SINGULAIR) 10 MG tablet, Take 1 tablet (10 mg total) by mouth at bedtime., Disp: 90 tablet, Rfl: 1 .  nystatin-triamcinolone (MYCOLOG II) cream, Apply 1 application topically 2 (two) times daily., Disp: , Rfl:  .  sertraline (ZOLOFT) 100 MG tablet, Take 2 tablets (200 mg total) by mouth daily., Disp: 180 tablet, Rfl: 1 .  simvastatin (ZOCOR) 40 MG tablet, Take 1 tablet (40 mg total) by mouth at bedtime., Disp: 90 tablet, Rfl: 1 .  traZODone (DESYREL) 50 MG tablet, Take 1-2 tablets (50-100 mg total) by mouth at bedtime as needed for sleep., Disp: 30 tablet, Rfl: 2 .  vitamin E 1000 UNIT capsule, Take 1,000 Units by mouth daily., Disp: , Rfl:    Allergies  Allergen Reactions  . Procaine Shortness Of Breath  . Niacin Hives   Past Medical History:  Diagnosis Date  . Allergy   . Bronchitis   . Glaucoma   . History of rabies vaccination 07/2019   run in with raccoon  . Hyperlipidemia   . Prediabetes 06/18/2020  . Psoriasis     Past Surgical History:  Procedure Laterality Date  . ABDOMINAL HYSTERECTOMY  1998    Social History   Socioeconomic History  . Marital status: Married    Spouse name: Not on file  . Number of children: Not on file  . Years of education: Not on file  . Highest education level: Not on file  Occupational History  . Not on file  Tobacco Use  . Smoking status: Former Games developer  . Smokeless tobacco: Never Used  Vaping Use  . Vaping Use: Never used  Substance and Sexual Activity  . Alcohol use: Yes    Comment: occ  . Drug use: Never  . Sexual activity: Not Currently  Other Topics Concern  . Not on file  Social History Narrative  . Not on file   Social Determinants of Health   Financial Resource Strain: Not on file  Food Insecurity: Not on file  Transportation Needs: Not  on file  Physical Activity: Not on file  Stress: Not on file  Social Connections: Not on file  Intimate Partner Violence: Not on file        Objective:    BP (!) 146/80   Pulse 90   Temp (!) 96.5 F (35.8 C) (Temporal)   Ht 5' 4.5" (1.638 m)   Wt 254 lb 9.6 oz (115.5 kg)   SpO2 96%   BMI 43.03 kg/m   Wt Readings from Last 3 Encounters:  12/19/20 254  lb 9.6 oz (115.5 kg)  11/06/20 257 lb 6.4 oz (116.8 kg)  08/06/20 258 lb (117 kg)    Physical Exam Vitals reviewed.  Constitutional:      General: She is not in acute distress.    Appearance: Normal appearance. She is morbidly obese. She is not ill-appearing, toxic-appearing or diaphoretic.  HENT:     Head: Normocephalic and atraumatic.  Eyes:     General: No scleral icterus.       Right eye: No discharge.        Left eye: No discharge.     Conjunctiva/sclera: Conjunctivae normal.  Cardiovascular:     Rate and Rhythm: Normal rate.  Pulmonary:     Effort: Pulmonary effort is normal. No respiratory distress.  Musculoskeletal:        General: Normal range of motion.     Cervical back: Normal range of motion.  Skin:    General: Skin is warm and dry.     Capillary Refill: Capillary refill takes less than 2 seconds.  Neurological:     General: No focal deficit present.     Mental Status: She is alert and oriented to person, place, and time. Mental status is at baseline.  Psychiatric:        Mood and Affect: Mood normal.        Behavior: Behavior normal.        Thought Content: Thought content normal.        Judgment: Judgment normal.     Lab Results  Component Value Date   TSH 2.82 11/27/2020   Lab Results  Component Value Date   WBC 6.4 11/27/2020   HGB 12.7 11/27/2020   HCT 39 11/27/2020   Lab Results  Component Value Date   NA 139 11/27/2020   K 4.8 11/27/2020   CO2 22 11/27/2020   BUN 10 11/27/2020   CREATININE 0.7 11/27/2020   ALKPHOS 76 11/27/2020   AST 19 11/27/2020   ALT 15 11/27/2020    ALBUMIN 4.4 11/27/2020   CALCIUM 9.0 11/27/2020   Lab Results  Component Value Date   CHOL 171 11/27/2020   Lab Results  Component Value Date   HDL 49 11/27/2020   Lab Results  Component Value Date   LDLCALC 98 11/27/2020   Lab Results  Component Value Date   TRIG 137 11/27/2020   No results found for: [facility] Lab Results  Component Value Date   HGBA1C 5.9 11/27/2020

## 2021-02-07 ENCOUNTER — Other Ambulatory Visit: Payer: Self-pay | Admitting: Family Medicine

## 2021-02-07 DIAGNOSIS — Z1231 Encounter for screening mammogram for malignant neoplasm of breast: Secondary | ICD-10-CM

## 2021-03-26 ENCOUNTER — Other Ambulatory Visit: Payer: Self-pay

## 2021-03-26 DIAGNOSIS — X58XXXA Exposure to other specified factors, initial encounter: Secondary | ICD-10-CM | POA: Insufficient documentation

## 2021-03-26 DIAGNOSIS — Z87891 Personal history of nicotine dependence: Secondary | ICD-10-CM | POA: Insufficient documentation

## 2021-03-26 DIAGNOSIS — Z7982 Long term (current) use of aspirin: Secondary | ICD-10-CM | POA: Insufficient documentation

## 2021-03-26 DIAGNOSIS — T162XXA Foreign body in left ear, initial encounter: Secondary | ICD-10-CM | POA: Diagnosis not present

## 2021-03-27 ENCOUNTER — Emergency Department (HOSPITAL_BASED_OUTPATIENT_CLINIC_OR_DEPARTMENT_OTHER)
Admission: EM | Admit: 2021-03-27 | Discharge: 2021-03-27 | Disposition: A | Discharge status: Home / Self Care | Payer: Commercial Managed Care - PPO | Attending: Emergency Medicine | Admitting: Emergency Medicine

## 2021-03-27 DIAGNOSIS — T162XXA Foreign body in left ear, initial encounter: Secondary | ICD-10-CM

## 2021-03-27 MED ORDER — OFLOXACIN 0.3 % OP SOLN
5.0000 [drp] | Freq: Every day | OPHTHALMIC | Status: DC
  Administered 2021-03-27: 5 [drp] via OTIC
  Filled 2021-03-27: qty 5

## 2021-03-27 NOTE — ED Provider Notes (Signed)
[facility] EMERGENCY DEPT Provider Note  CSN: [id number] Arrival date & time: 03/26/21 2356  Chief Complaint(s) Foreign Body in Ear (Bug in ear) Patient comes in with bug in L ear.  Patient states she was sitting on the couch and felt the bug fly into her L ear.  Patient states it happened around 2305 and she can feel it moving.  Unsure what type of bug.  Pain 8/10 L ear  HPI Jasmine Kim is a 65 y.o. female with bug in the left ear with associated pain. No hearing loss. No headache.   HPI  Past Medical History Past Medical History:  Diagnosis Date   Allergy    Bronchitis    Glaucoma    History of rabies vaccination 07/2019   run in with raccoon   Hyperlipidemia    Prediabetes 06/18/2020   Psoriasis    Patient Active Problem List   Diagnosis Date Noted   Left elbow pain 08/06/2020   Depression, recurrent (HCC) 08/06/2020   Prediabetes 06/18/2020   Vitamin D deficiency 07/01/2018   Chronic seasonal allergic rhinitis 12/30/2017   Dyslipidemia 04/01/2015   Morbid obesity with BMI of 40.0-44.9, adult (HCC) 04/01/2015   Anxiety, generalized 03/25/2015   Home Medication(s) Prior to Admission medications   Medication Sig Start Date End Date Taking? Authorizing Provider  albuterol (VENTOLIN HFA) 108 (90 Base) MCG/ACT inhaler SMARTSIG:2 Puff(s) By Mouth Every 4 Hours PRN 06/26/20   Gwenlyn Fudge, FNP  aspirin EC 81 MG tablet Take 81 mg by mouth daily.    [provider]  brimonidine (ALPHAGAN) 0.15 % ophthalmic solution 1 drop 3 (three) times daily. 08/18/19   [provider]  Calcium Carbonate-Vit D-Min (CALCIUM 1200 PO) Take by mouth.    [provider]  cholecalciferol (VITAMIN D3) 25 MCG (1000 UT) tablet Take 2,000 Units by mouth daily.    [provider]  Clotrimazole (MYCELEX OTC EX) Apply topically.    [provider]  diclofenac Sodium (VOLTAREN) 1 % GEL Apply 2 g topically 4 (four) times daily. 08/06/20    Daryll Drown, NP  ezetimibe (ZETIA) 10 MG tablet Take 1 tablet (10 mg total) by mouth daily. 12/19/20   Gwenlyn Fudge, FNP  halobetasol (ULTRAVATE) 0.05 % cream Apply topically 2 (two) times daily.    [provider]  Homeopathic Products [facility] COLD REMEDY NA) Place into the nose.    [provider]  ibuprofen (ADVIL) 600 MG tablet Take 1 tablet (600 mg total) by mouth every 8 (eight) hours as needed. 08/06/20   Daryll Drown, NP  minocycline (DYNACIN) 50 MG tablet Take 50 mg by mouth 2 (two) times daily.    [provider]  montelukast (SINGULAIR) 10 MG tablet Take 1 tablet (10 mg total) by mouth at bedtime. 12/19/20   Gwenlyn Fudge, FNP  nystatin-triamcinolone (MYCOLOG II) cream Apply 1 application topically 2 (two) times daily.    [provider]  sertraline (ZOLOFT) 100 MG tablet Take 2 tablets (200 mg total) by mouth daily. 12/19/20   Gwenlyn Fudge, FNP  simvastatin (ZOCOR) 40 MG tablet Take 1 tablet (40 mg total) by mouth at bedtime. 11/06/20   Gwenlyn Fudge, FNP  traZODone (DESYREL) 50 MG tablet Take 1 tablet (50 mg total) by mouth at bedtime. 12/19/20   Gwenlyn Fudge, FNP  vitamin E 1000 UNIT capsule Take 1,000 Units by mouth daily.    [provider]  Past Surgical History Past Surgical History:  Procedure Laterality Date   ABDOMINAL HYSTERECTOMY  1998   Family History Family History  Problem Relation Age of Onset   Uterine cancer Mother    Pancreatic cancer Father    Cancer Brother        Unknown type   Heart disease Sister    Colon cancer Sister        85s   Diabetes Maternal Grandmother    Hypertension Maternal Grandmother    Arthritis Maternal Grandmother     Social History Social History   Tobacco Use   Smoking status: Former    Pack years: 0.00   Smokeless tobacco: Never   Vaping Use   Vaping Use: Never used  Substance Use Topics   Alcohol use: Yes    Comment: occ   Drug use: Never   Allergies Procaine and Niacin  Review of Systems Review of Systems  Physical Exam Vital Signs  I have reviewed the triage vital signs BP (!) 153/68 (BP Location: Left Arm)   Pulse (!) 102   Temp 98.4 F (36.9 C) (Oral)   Resp 20   Ht 5\' 5"  (1.651 m)   Wt 115.7 kg   SpO2 96%   BMI 42.43 kg/m   Physical Exam Vitals reviewed.  Constitutional:      General: She is not in acute distress.    Appearance: She is well-developed. She is not diaphoretic.  HENT:     Head: Normocephalic and atraumatic.     Right Ear: Tympanic membrane and external ear normal.     Left Ear: Tympanic membrane and external ear normal.     Ears:     Comments: Mild bleeding to external ear canal. No deep lacerations.    Nose: Nose normal.  Eyes:     General: No scleral icterus.    Conjunctiva/sclera: Conjunctivae normal.  Neck:     Trachea: Phonation normal.  Cardiovascular:     Rate and Rhythm: Normal rate and regular rhythm.  Pulmonary:     Effort: Pulmonary effort is normal. No respiratory distress.     Breath sounds: No stridor.  Abdominal:     General: There is no distension.  Musculoskeletal:        General: Normal range of motion.     Cervical back: Normal range of motion.  Neurological:     Mental Status: She is alert and oriented to person, place, and time.  Psychiatric:        Behavior: Behavior normal.    ED Results and Treatments Labs (all labs ordered are listed, but only abnormal results are displayed) Labs Reviewed - No data to display                                                                                                                       EKG  EKG Interpretation  Date/Time:    Ventricular Rate:    PR Interval:    QRS Duration:  QT Interval:    QTC Calculation:   R Axis:     Text Interpretation:          Radiology No results  found.  Pertinent labs & imaging results that were available during my care of the patient were reviewed by me and considered in my medical decision making (see chart for details).  Medications Ordered in ED Medications  ofloxacin (OCUFLOX) 0.3 % ophthalmic solution 5 drop (has no administration in time range)                                                                                                                                    Procedures Procedures  (including critical care time)  Medical Decision Making / ED Course I have reviewed the nursing notes for this encounter and the patient's prior records (if available in EHR or on provided paperwork).   BRANTLEY NASER was evaluated in Emergency Department on 03/27/2021 for the symptoms described in the history of present illness. She was evaluated in the context of the global COVID-19 pandemic, which necessitated consideration that the patient might be at risk for infection with the SARS-CoV-2 virus that causes COVID-19. Institutional protocols and algorithms that pertain to the evaluation of patients at risk for COVID-19 are in a state of rapid change based on information released by regulatory bodies including the CDC and federal and state organizations. These policies and algorithms were followed during the patient's care in the ED.  Left ear FB removed in triage by RN. Noted to have some minor canal trauma. Will give ppx ofloxacin drops.      Final Clinical Impression(s) / ED Diagnoses Final diagnoses:  Ear foreign body, left, initial encounter   The patient appears reasonably screened and/or stabilized for discharge and I doubt any other medical condition or other [facility] requiring further screening, evaluation, or treatment in the ED at this time prior to discharge. Safe for discharge with strict return precautions.  Disposition: Discharge  Condition: Good  I have discussed the results, Dx and Tx plan with the  patient/family who expressed understanding and agree(s) with the plan. Discharge instructions discussed at length. The patient/family was given strict return precautions who verbalized understanding of the instructions. No further questions at time of discharge.    ED Discharge Orders     None        Follow Up: Gwenlyn Fudge, FNP [id number] [place] [address] [phone]  Call  as needed      This chart was dictated using voice recognition software.  Despite best efforts to proofread,  errors can occur which can change the documentation meaning.    Nira Conn, MD 03/27/21 0110

## 2021-03-27 NOTE — ED Triage Notes (Signed)
  Patient comes in with bug in L ear.  Patient states she was sitting on the couch and felt the bug fly into her L ear.  Patient states it happened around 2305 and she can feel it moving.  Unsure what type of bug.  Pain 8/10 L ear

## 2021-03-27 NOTE — Discharge Instructions (Addendum)
Use 5 drops per day for 5 days.

## 2021-04-02 ENCOUNTER — Other Ambulatory Visit: Payer: Self-pay

## 2021-04-02 ENCOUNTER — Ambulatory Visit
Admission: RE | Admit: 2021-04-02 | Discharge: 2021-04-02 | Disposition: A | Discharge status: Home / Self Care | Payer: Commercial Managed Care - PPO | Source: Ambulatory Visit | Attending: Family Medicine | Admitting: Family Medicine

## 2021-04-02 DIAGNOSIS — Z1231 Encounter for screening mammogram for malignant neoplasm of breast: Secondary | ICD-10-CM

## 2021-04-22 ENCOUNTER — Ambulatory Visit: Payer: Commercial Managed Care - PPO | Admitting: Family Medicine

## 2021-04-24 ENCOUNTER — Encounter: Payer: Self-pay | Admitting: Family Medicine

## 2021-05-15 ENCOUNTER — Ambulatory Visit (INDEPENDENT_AMBULATORY_CARE_PROVIDER_SITE_OTHER): Payer: Commercial Managed Care - PPO | Admitting: Family Medicine

## 2021-05-15 ENCOUNTER — Encounter: Payer: Self-pay | Admitting: Family Medicine

## 2021-05-15 ENCOUNTER — Other Ambulatory Visit: Payer: Self-pay

## 2021-05-15 VITALS — BP 134/81 | HR 66 | Temp 97.6°F | Ht 65.0 in | Wt 249.0 lb

## 2021-05-15 DIAGNOSIS — E559 Vitamin D deficiency, unspecified: Secondary | ICD-10-CM

## 2021-05-15 DIAGNOSIS — E785 Hyperlipidemia, unspecified: Secondary | ICD-10-CM | POA: Diagnosis not present

## 2021-05-15 DIAGNOSIS — F339 Major depressive disorder, recurrent, unspecified: Secondary | ICD-10-CM | POA: Diagnosis not present

## 2021-05-15 DIAGNOSIS — Z6841 Body Mass Index (BMI) 40.0 and over, adult: Secondary | ICD-10-CM

## 2021-05-15 DIAGNOSIS — F411 Generalized anxiety disorder: Secondary | ICD-10-CM

## 2021-05-15 DIAGNOSIS — R7303 Prediabetes: Secondary | ICD-10-CM

## 2021-05-15 LAB — BAYER DCA HB A1C WAIVED: HB A1C (BAYER DCA - WAIVED): 6.2 % (ref <7.0)

## 2021-05-15 MED ORDER — TRAZODONE HCL 50 MG PO TABS: 50.0000 mg | ORAL_TABLET | Freq: Every day | ORAL | 1 refills | Status: DC

## 2021-05-15 MED ORDER — NYSTATIN-TRIAMCINOLONE 100000-0.1 UNIT/GM-% EX CREA: 1.0000 "application " | TOPICAL_CREAM | Freq: Two times a day (BID) | CUTANEOUS | 1 refills | Status: DC

## 2021-05-15 MED ORDER — SERTRALINE HCL 100 MG PO TABS: 200.0000 mg | ORAL_TABLET | Freq: Every day | ORAL | 1 refills | Status: DC

## 2021-05-15 MED ORDER — EZETIMIBE 10 MG PO TABS: 10.0000 mg | ORAL_TABLET | Freq: Every day | ORAL | 1 refills | Status: DC

## 2021-05-15 MED ORDER — SIMVASTATIN 40 MG PO TABS: 40.0000 mg | ORAL_TABLET | Freq: Every day | ORAL | 1 refills | Status: DC

## 2021-05-15 NOTE — Patient Instructions (Signed)
Reminder: please return after mid-September for your flu shot. If you receive this elsewhere, such as your pharmacy, please let us know so we can get this documented in your chart.   

## 2021-05-15 NOTE — Progress Notes (Signed)
Assessment & Plan:  1. Depression, recurrent ([place]) Well controlled on current regimen.  - CMP14+EGFR - traZODone (DESYREL) 50 MG tablet; Take 1 tablet (50 mg total) by mouth at bedtime.  Dispense: 90 tablet; Refill: 1  2. Anxiety, generalized Well controlled on current regimen.  - CMP14+EGFR - sertraline (ZOLOFT) 100 MG tablet; Take 2 tablets (200 mg total) by mouth daily.  Dispense: 180 tablet; Refill: 1  3. Vitamin D deficiency Taking OTC supplement.  4. Dyslipidemia Labs to assess. - Lipid panel - CMP14+EGFR - simvastatin (ZOCOR) 40 MG tablet; Take 1 tablet (40 mg total) by mouth at bedtime.  Dispense: 90 tablet; Refill: 1 - ezetimibe (ZETIA) 10 MG tablet; Take 1 tablet (10 mg total) by mouth daily.  Dispense: 90 tablet; Refill: 1  5. Prediabetes A1c increased from 5.9 to 6.2 - encouraged diet and exercise. - Lipid panel - CMP14+EGFR - CBC with Differential/Platelet - Bayer DCA Hb A1c Waived  6. Morbid obesity with BMI of 40.0-44.9, adult ([place]) Diet and exercise encouraged. - Lipid panel - CMP14+EGFR - CBC with Differential/Platelet   Return in about 6 months (around 11/15/2021) for annual physical.  Hendricks Limes, MSN, APRN, FNP-[facility]  Subjective:    [name], female    DOB: [birth year], 65 y.o.   MRN: [id number]  Patient Care Team: Loman Brooklyn, FNP as PCP - General (Family Medicine) Marylynn Pearson, MD as Consulting Physician (Ophthalmology) Lavonna Monarch, MD as Consulting Physician (Dermatology) Michel Harrow, [place] as Consulting Physician (Chiropractic Medicine)   Chief Complaint:  Chief Complaint  Patient presents with   Prediabetes   Depression    Check up of chronic medical conditions     HPI: Jasmine Kim is a 65 y.o. female presenting on 05/15/2021 for Prediabetes and Depression (Check up of chronic medical conditions )  Depression/Anxiety: patient states she is doing better. She is almost  done renovating her bedroom.   Depression screen [facility] 2/9 05/15/2021 12/19/2020 11/06/2020  Decreased Interest 0 1 2  Down, Depressed, Hopeless 1 1 2   PHQ - 2 Score 1 2 4   Altered sleeping 0 1 2  Tired, decreased energy 1 1 2   Change in appetite 1 2 1   Feeling bad or failure about yourself  0 0 0  Trouble concentrating 0 0 0  Moving slowly or fidgety/restless 0 0 1  Suicidal thoughts 0 0 0  PHQ-9 Score 3 6 10   Difficult doing work/chores Not difficult at all Somewhat difficult Somewhat difficult   GAD 7 : Generalized Anxiety Score 05/15/2021 12/19/2020 11/06/2020 08/06/2020  Nervous, Anxious, on Edge 1 1 2 1   Control/stop worrying 0 0 1 1  Worry too much - different things 0 0 1 1  Trouble relaxing 0 0 0 0  Restless 0 1 1 1   Easily annoyed or irritable 1 0 2 1  Afraid - awful might happen 0 0 0 0  Total GAD 7 Score 2 2 7 5   Anxiety Difficulty Somewhat difficult Not difficult at all Not difficult at all -    Vitamin D deficiency: advised to increase vitamin D dose in March 2022.   Dyslipidemia: taking Zetia daily.   Prediabetes: no medications.  New complaints: None   Social history:  Relevant past medical, surgical, family and social history reviewed and updated as indicated. Interim medical history since our last visit reviewed.  Allergies and medications reviewed and updated.  DATA REVIEWED: CHART IN EPIC  ROS: Negative  unless specifically indicated above in HPI.    Current Outpatient Medications:    albuterol (VENTOLIN HFA) 108 (90 Base) MCG/ACT inhaler, SMARTSIG:2 Puff(s) By Mouth Every 4 Hours PRN, Disp: 18 g, Rfl: 2   aspirin EC 81 MG tablet, Take 81 mg by mouth daily., Disp: , Rfl:    brimonidine (ALPHAGAN) 0.15 % ophthalmic solution, 1 drop 3 (three) times daily., Disp: , Rfl:    Calcium Carbonate-Vit D-Min (CALCIUM 1200 PO), Take by mouth., Disp: , Rfl:    cholecalciferol (VITAMIN D3) 25 MCG (1000 UT) tablet, Take 2,000 Units by mouth daily., Disp: , Rfl:     Clotrimazole (MYCELEX OTC EX), Apply topically., Disp: , Rfl:    diclofenac Sodium (VOLTAREN) 1 % GEL, Apply 2 g topically 4 (four) times daily., Disp: 50 g, Rfl: 2   ezetimibe (ZETIA) 10 MG tablet, Take 1 tablet (10 mg total) by mouth daily., Disp: 90 tablet, Rfl: 1   Homeopathic Products (ZICAM COLD REMEDY NA), Place into the nose., Disp: , Rfl:    ibuprofen (ADVIL) 600 MG tablet, Take 1 tablet (600 mg total) by mouth every 8 (eight) hours as needed., Disp: 30 tablet, Rfl: 0   montelukast (SINGULAIR) 10 MG tablet, Take 1 tablet (10 mg total) by mouth at bedtime., Disp: 90 tablet, Rfl: 1   nystatin-triamcinolone (MYCOLOG II) cream, Apply 1 application topically 2 (two) times daily., Disp: , Rfl:    sertraline (ZOLOFT) 100 MG tablet, Take 2 tablets (200 mg total) by mouth daily., Disp: 180 tablet, Rfl: 1   simvastatin (ZOCOR) 40 MG tablet, Take 1 tablet (40 mg total) by mouth at bedtime., Disp: 90 tablet, Rfl: 1   traZODone (DESYREL) 50 MG tablet, Take 1 tablet (50 mg total) by mouth at bedtime., Disp: 90 tablet, Rfl: 1   vitamin E 1000 UNIT capsule, Take 1,000 Units by mouth daily., Disp: , Rfl:    Allergies  Allergen Reactions   Procaine Shortness Of Breath   Niacin Hives   Past Medical History:  Diagnosis Date   Allergy    Bronchitis    Glaucoma    History of rabies vaccination 07/2019   run in with raccoon   Hyperlipidemia    Prediabetes 06/18/2020   Psoriasis     Past Surgical History:  Procedure Laterality Date   ABDOMINAL HYSTERECTOMY  1998    Social History   Socioeconomic History   Marital status: Married    Spouse name: Not on file   Number of children: Not on file   Years of education: Not on file   Highest education level: Not on file  Occupational History   Not on file  Tobacco Use   Smoking status: Former   Smokeless tobacco: Never  Vaping Use   Vaping Use: Never used  Substance and Sexual Activity   Alcohol use: Yes    Comment: occ   Drug use: Never    Sexual activity: Not Currently  Other Topics Concern   Not on file  Social History Narrative   Not on file   Social Determinants of Health   Financial Resource Strain: Not on file  Food Insecurity: Not on file  Transportation Needs: Not on file  Physical Activity: Not on file  Stress: Not on file  Social Connections: Not on file  Intimate Partner Violence: Not on file        Objective:    BP 134/81   Pulse 66   Temp 97.6 F (36.4 C) (Temporal)  Ht 5' 5"  (1.651 m)   Wt 249 lb (112.9 kg)   SpO2 93%   BMI 41.44 kg/m   Wt Readings from Last 3 Encounters:  05/15/21 249 lb (112.9 kg)  03/27/21 255 lb (115.7 kg)  12/19/20 254 lb 9.6 oz (115.5 kg)    Physical Exam Vitals reviewed.  Constitutional:      General: She is not in acute distress.    Appearance: Normal appearance. She is morbidly obese. She is not ill-appearing, toxic-appearing or diaphoretic.  HENT:     Head: Normocephalic and atraumatic.  Eyes:     General: No scleral icterus.       Right eye: No discharge.        Left eye: No discharge.     Conjunctiva/sclera: Conjunctivae normal.  Cardiovascular:     Rate and Rhythm: Normal rate.  Pulmonary:     Effort: Pulmonary effort is normal. No respiratory distress.  Musculoskeletal:        General: Normal range of motion.     Cervical back: Normal range of motion.  Skin:    General: Skin is warm and dry.     Capillary Refill: Capillary refill takes less than 2 seconds.  Neurological:     General: No focal deficit present.     Mental Status: She is alert and oriented to person, place, and time. Mental status is at baseline.  Psychiatric:        Mood and Affect: Mood normal.        Behavior: Behavior normal.        Thought Content: Thought content normal.        Judgment: Judgment normal.    Lab Results  Component Value Date   TSH 2.82 11/27/2020   Lab Results  Component Value Date   WBC 6.4 11/27/2020   HGB 12.7 11/27/2020   HCT 39 11/27/2020    Lab Results  Component Value Date   NA 139 11/27/2020   K 4.8 11/27/2020   CO2 22 11/27/2020   BUN 10 11/27/2020   CREATININE 0.7 11/27/2020   ALKPHOS 76 11/27/2020   AST 19 11/27/2020   ALT 15 11/27/2020   ALBUMIN 4.4 11/27/2020   CALCIUM 9.0 11/27/2020   Lab Results  Component Value Date   CHOL 171 11/27/2020   Lab Results  Component Value Date   HDL 49 11/27/2020   Lab Results  Component Value Date   LDLCALC 98 11/27/2020   Lab Results  Component Value Date   TRIG 137 11/27/2020   No results found for: [facility] Lab Results  Component Value Date   HGBA1C 5.9 11/27/2020

## 2021-05-16 LAB — CMP14+EGFR
ALT: 18 IU/L (ref 0–32)
AST: 20 IU/L (ref 0–40)
Albumin/Globulin Ratio: 1.9 (ref 1.2–2.2)
Albumin: 4.5 g/dL (ref 3.8–4.8)
Alkaline Phosphatase: 82 IU/L (ref 44–121)
BUN/Creatinine Ratio: 15 (ref 12–28)
BUN: 11 mg/dL (ref 8–27)
Bilirubin Total: 0.3 mg/dL (ref 0.0–1.2)
CO2: 22 mmol/L (ref 20–29)
Calcium: 9.1 mg/dL (ref 8.7–10.3)
Chloride: 103 mmol/L (ref 96–106)
Creatinine, Ser: 0.72 mg/dL (ref 0.57–1.00)
Globulin, Total: 2.4 g/dL (ref 1.5–4.5)
Glucose: 112 mg/dL — ABNORMAL HIGH (ref 65–99)
Potassium: 4.8 mmol/L (ref 3.5–5.2)
Sodium: 138 mmol/L (ref 134–144)
Total Protein: 6.9 g/dL (ref 6.0–8.5)
eGFR: 92 mL/min/{1.73_m2} (ref >59)

## 2021-05-16 LAB — CBC WITH DIFFERENTIAL/PLATELET
Basophils Absolute: 0.1 10*3/uL (ref 0.0–0.2)
Basos: 1 %
EOS (ABSOLUTE): 0.3 10*3/uL (ref 0.0–0.4)
Eos: 4 %
Hematocrit: 38.7 % (ref 34.0–46.6)
Hemoglobin: 12.6 g/dL (ref 11.1–15.9)
Immature Grans (Abs): 0 10*3/uL (ref 0.0–0.1)
Immature Granulocytes: 0 %
Lymphocytes Absolute: 2.2 10*3/uL (ref 0.7–3.1)
Lymphs: 33 %
MCH: 29.1 pg (ref 26.6–33.0)
MCHC: 32.6 g/dL (ref 31.5–35.7)
MCV: 89 fL (ref 79–97)
Monocytes Absolute: 0.5 10*3/uL (ref 0.1–0.9)
Monocytes: 8 %
Neutrophils Absolute: 3.6 10*3/uL (ref 1.4–7.0)
Neutrophils: 54 %
Platelets: 278 10*3/uL (ref 150–450)
RBC: 4.33 x10E6/uL (ref 3.77–5.28)
RDW: 12.9 % (ref 11.7–15.4)
WBC: 6.6 10*3/uL (ref 3.4–10.8)

## 2021-05-16 LAB — LIPID PANEL
Chol/HDL Ratio: 4.1 ratio (ref 0.0–4.4)
Cholesterol, Total: 207 mg/dL — ABNORMAL HIGH (ref 100–199)
HDL: 51 mg/dL (ref >39)
LDL Chol Calc (NIH): 137 mg/dL — ABNORMAL HIGH (ref 0–99)
Triglycerides: 107 mg/dL (ref 0–149)
VLDL Cholesterol Cal: 19 mg/dL (ref 5–40)

## 2021-07-11 ENCOUNTER — Encounter: Payer: Self-pay | Admitting: Family Medicine

## 2021-07-11 DIAGNOSIS — F411 Generalized anxiety disorder: Secondary | ICD-10-CM

## 2021-07-13 MED ORDER — SERTRALINE HCL 100 MG PO TABS: 200.0000 mg | ORAL_TABLET | Freq: Every day | ORAL | 1 refills | Status: DC

## 2021-07-18 ENCOUNTER — Telehealth: Payer: Self-pay | Admitting: *Deleted

## 2021-07-18 MED ORDER — CLOTRIMAZOLE 1 % EX CREA: 1.0000 "application " | TOPICAL_CREAM | CUTANEOUS | 2 refills | Status: DC

## 2021-07-18 NOTE — Telephone Encounter (Signed)
Fax from [facility] Following pharmacist recommended medication therapy change requested under [facility] employee benefit plann to lower monthly out of pocket cost to member Consideration: DC Nystatin-Triamcinolone Cr 0.1% Alternatives: Clotrim/Betameth Cr 1%-0.5% apply BID, Clotrimazole cr 1% apply to affected area as directed & Hydrocortisone cr 2.50% apply once daily as directed  Per Deliah Boston response on fax paper, sending in Clotrimazole cr 1% apply to affected area as directed qty 30 w/ 2 refills, electronically so that our records are up to date  Pt aware of change in cream

## 2021-11-03 ENCOUNTER — Encounter: Payer: Self-pay | Admitting: Family Medicine

## 2021-11-13 LAB — HEPATIC FUNCTION PANEL
ALT: 20 (ref 7–35)
AST: 22 (ref 13–35)
Alkaline Phosphatase: 88 (ref 25–125)

## 2021-11-13 LAB — COMPREHENSIVE METABOLIC PANEL
Albumin: 4.3 (ref 3.5–5.0)
Calcium: 9.4 (ref 8.7–10.7)
Globulin: 2.9
eGFR: 97

## 2021-11-13 LAB — BASIC METABOLIC PANEL
CO2: 20 (ref 13–22)
Chloride: 102 (ref 99–108)
Creatinine: 0.6 (ref 0.5–1.1)
Glucose: 106
Potassium: 4.6 (ref 3.4–5.3)
Sodium: 138 (ref 137–147)

## 2021-11-13 LAB — CBC AND DIFFERENTIAL
HCT: 39 (ref 36–46)
Hemoglobin: 13.1 (ref 12.0–16.0)
Neutrophils Absolute: 4
Platelets: 295 (ref 150–399)
WBC: 6.7

## 2021-11-13 LAB — CBC: RBC: 4.36 (ref 3.87–5.11)

## 2021-11-13 LAB — LIPID PANEL
Cholesterol: 183 (ref 0–200)
HDL: 50 (ref 35–70)
LDL Cholesterol: 109
Triglycerides: 130 (ref 40–160)

## 2021-11-13 LAB — HEMOGLOBIN A1C: Hemoglobin A1C: 5.8

## 2021-11-19 ENCOUNTER — Encounter: Payer: Self-pay | Admitting: Family Medicine

## 2021-11-19 ENCOUNTER — Ambulatory Visit (INDEPENDENT_AMBULATORY_CARE_PROVIDER_SITE_OTHER): Payer: Commercial Managed Care - PPO | Admitting: Family Medicine

## 2021-11-19 VITALS — BP 132/79 | HR 78 | Temp 97.2°F | Ht 65.0 in | Wt 246.4 lb

## 2021-11-19 DIAGNOSIS — J302 Other seasonal allergic rhinitis: Secondary | ICD-10-CM

## 2021-11-19 DIAGNOSIS — E559 Vitamin D deficiency, unspecified: Secondary | ICD-10-CM

## 2021-11-19 DIAGNOSIS — F339 Major depressive disorder, recurrent, unspecified: Secondary | ICD-10-CM | POA: Diagnosis not present

## 2021-11-19 DIAGNOSIS — Z6841 Body Mass Index (BMI) 40.0 and over, adult: Secondary | ICD-10-CM

## 2021-11-19 DIAGNOSIS — F411 Generalized anxiety disorder: Secondary | ICD-10-CM | POA: Diagnosis not present

## 2021-11-19 DIAGNOSIS — Z0000 Encounter for general adult medical examination without abnormal findings: Secondary | ICD-10-CM

## 2021-11-19 DIAGNOSIS — R7303 Prediabetes: Secondary | ICD-10-CM

## 2021-11-19 DIAGNOSIS — E785 Hyperlipidemia, unspecified: Secondary | ICD-10-CM | POA: Diagnosis not present

## 2021-11-19 MED ORDER — SERTRALINE HCL 100 MG PO TABS: 200.0000 mg | ORAL_TABLET | Freq: Every day | ORAL | 1 refills | Status: DC

## 2021-11-19 MED ORDER — MONTELUKAST SODIUM 10 MG PO TABS: 10.0000 mg | ORAL_TABLET | Freq: Every day | ORAL | 1 refills | Status: DC

## 2021-11-19 MED ORDER — TRAZODONE HCL 50 MG PO TABS: 50.0000 mg | ORAL_TABLET | Freq: Every day | ORAL | 1 refills | Status: DC

## 2021-11-19 MED ORDER — SIMVASTATIN 40 MG PO TABS: 40.0000 mg | ORAL_TABLET | Freq: Every day | ORAL | 1 refills | Status: DC

## 2021-11-19 MED ORDER — EZETIMIBE 10 MG PO TABS: 10.0000 mg | ORAL_TABLET | Freq: Every day | ORAL | 1 refills | Status: DC

## 2021-11-19 NOTE — Progress Notes (Signed)
Assessment & Plan:  1. Depression, recurrent ([place]) Well controlled on current regimen.  - traZODone (DESYREL) 50 MG tablet; Take 1 tablet (50 mg total) by mouth at bedtime.  Dispense: 90 tablet; Refill: 1 - sertraline (ZOLOFT) 100 MG tablet; Take 2 tablets (200 mg total) by mouth daily.  Dispense: 180 tablet; Refill: 1  2. Anxiety, generalized Well controlled on current regimen.  - sertraline (ZOLOFT) 100 MG tablet; Take 2 tablets (200 mg total) by mouth daily.  Dispense: 180 tablet; Refill: 1  3. Vitamin D deficiency Continue vitamin D supplement. Will re-check vitamin D level with her next lab work, which she completes at work.  4. Dyslipidemia Well controlled on current regimen.  - ezetimibe (ZETIA) 10 MG tablet; Take 1 tablet (10 mg total) by mouth daily.  Dispense: 90 tablet; Refill: 1 - simvastatin (ZOCOR) 40 MG tablet; Take 1 tablet (40 mg total) by mouth at bedtime.  Dispense: 90 tablet; Refill: 1  5. Prediabetes Controlled without medication. A1c improved from 6.2 to 5.8.   6. Morbid obesity with BMI of 40.0-44.9, adult ([place]) Encouraged healthy eating and exercise.   7. Chronic seasonal allergic rhinitis Well controlled on current regimen.  - montelukast (SINGULAIR) 10 MG tablet; Take 1 tablet (10 mg total) by mouth at bedtime.  Dispense: 90 tablet; Refill: 1  8. Healthcare maintenance Patient will get Shingrix and the influenza vaccine at the pharmacy.    Return in about 6 months (around 05/19/2022) for annual physical.  Hendricks Limes, MSN, APRN, FNP-C [facility]  Subjective:    [name], female    DOB: 11/13/1955, 66 y.o.   MRN: [id number]  Patient Care Team: Loman Brooklyn, FNP as PCP - General (Family Medicine) Marylynn Pearson, MD as Consulting Physician (Ophthalmology) Lavonna Monarch, MD as Consulting Physician (Dermatology) Michel Harrow, [place] as Consulting Physician (Chiropractic Medicine)   Chief Complaint:   Chief Complaint  Patient presents with   Follow-up     6 month follow up    HPI: Jasmine Kim is a 66 y.o. female presenting on 11/19/2021 for Follow-up ( 6 month follow up)  Depression/Anxiety: patient states she is doing better. Denies s/s of bleeding with concomitant use of diclofenac.   Depression screen [facility] 2/9 11/19/2021 05/15/2021 12/19/2020  Decreased Interest 1 0 1  Down, Depressed, Hopeless _0 PHQ - 2 Score _1 Altered sleeping 1 0 1  Tired, decreased energy _2 Change in appetite 0 1 2  Feeling bad or failure about yourself  0 0 0  Trouble concentrating 0 0 0  Moving slowly or fidgety/restless 0 0 0  Suicidal thoughts 0 0 0  PHQ-9 Score _3 Difficult doing work/chores Somewhat difficult Not difficult at all Somewhat difficult   GAD 7 : Generalized Anxiety Score 11/19/2021 05/15/2021 12/19/2020 11/06/2020  Nervous, Anxious, on Edge _4 Control/stop worrying 0 0 0 1  Worry too much - different things 1 0 0 1  Trouble relaxing 0 0 0 0  Restless 0 0 1 1  Easily annoyed or irritable 1 1 0 2  Afraid - awful might happen 0 0 0 0  Total GAD 7 Score _5 Anxiety Difficulty - Somewhat difficult Not difficult at all Not difficult at all    Vitamin D deficiency: taking a vitamin D supplement. Last vitamin D level slightly low at 29.5 in February  2022.  Dyslipidemia: taking Zetia daily.   Prediabetes: no medications. Last A1c 6.2.  New complaints: None   Social history:  Relevant past medical, surgical, family and social history reviewed and updated as indicated. Interim medical history since our last visit reviewed.  Allergies and medications reviewed and updated.  DATA REVIEWED: CHART IN EPIC  ROS: Negative unless specifically indicated above in HPI.    Current Outpatient Medications:    albuterol (VENTOLIN HFA) 108 (90 Base) MCG/ACT inhaler, SMARTSIG:2 Puff(s) By Mouth Every 4 Hours PRN, Disp: 18 g, Rfl: 2   aspirin EC 81 MG tablet, Take  81 mg by mouth daily., Disp: , Rfl:    brimonidine (ALPHAGAN) 0.15 % ophthalmic solution, 1 drop 3 (three) times daily., Disp: , Rfl:    Calcium Carbonate-Vit D-Min (CALCIUM 1200 PO), Take by mouth., Disp: , Rfl:    cholecalciferol (VITAMIN D3) 25 MCG (1000 UT) tablet, Take 2,000 Units by mouth daily., Disp: , Rfl:    clotrimazole (LOTRIMIN) 1 % cream, Apply 1 application topically as directed. To affected area, Disp: 30 g, Rfl: 2   diclofenac Sodium (VOLTAREN) 1 % GEL, Apply 2 g topically 4 (four) times daily., Disp: 50 g, Rfl: 2   ezetimibe (ZETIA) 10 MG tablet, Take 1 tablet (10 mg total) by mouth daily., Disp: 90 tablet, Rfl: 1   Homeopathic Products (ZICAM COLD REMEDY NA), Place into the nose., Disp: , Rfl:    ibuprofen (ADVIL) 600 MG tablet, Take 1 tablet (600 mg total) by mouth every 8 (eight) hours as needed., Disp: 30 tablet, Rfl: 0   montelukast (SINGULAIR) 10 MG tablet, Take 1 tablet (10 mg total) by mouth at bedtime., Disp: 90 tablet, Rfl: 1   sertraline (ZOLOFT) 100 MG tablet, Take 2 tablets (200 mg total) by mouth daily., Disp: 180 tablet, Rfl: 1   simvastatin (ZOCOR) 40 MG tablet, Take 1 tablet (40 mg total) by mouth at bedtime., Disp: 90 tablet, Rfl: 1   traZODone (DESYREL) 50 MG tablet, Take 1 tablet (50 mg total) by mouth at bedtime., Disp: 90 tablet, Rfl: 1   vitamin E 1000 UNIT capsule, Take 1,000 Units by mouth daily., Disp: , Rfl:    Allergies  Allergen Reactions   Procaine Shortness Of Breath   Niacin Hives   Past Medical History:  Diagnosis Date   Allergy    Bronchitis    Glaucoma    History of rabies vaccination 07/2019   run in with raccoon   Hyperlipidemia    Prediabetes 06/18/2020   Psoriasis     Past Surgical History:  Procedure Laterality Date   ABDOMINAL HYSTERECTOMY  1998    Social History   Socioeconomic History   Marital status: Married    Spouse name: Not on file   Number of children: Not on file   Years of education: Not on file   Highest  education level: Not on file  Occupational History   Not on file  Tobacco Use   Smoking status: Former   Smokeless tobacco: Never  Vaping Use   Vaping Use: Never used  Substance and Sexual Activity   Alcohol use: Yes    Comment: occ   Drug use: Never   Sexual activity: Not Currently  Other Topics Concern   Not on file  Social History Narrative   Not on file   Social Determinants of Health   Financial Resource Strain: Not on file  Food Insecurity: Not on file  Transportation Needs: Not on file  Physical Activity: Not on file  Stress: Not on file  Social Connections: Not on file  Intimate Partner Violence: Not on file        Objective:    BP 132/79    Pulse 78    Temp (!) 97.2 F (36.2 C) (Temporal)    Ht _0  (1.651 m)    Wt 246 lb 6.4 oz (111.8 kg)    SpO2 94%    BMI 41.00 kg/m   Wt Readings from Last 3 Encounters:  11/19/21 246 lb 6.4 oz (111.8 kg)  05/15/21 249 lb (112.9 kg)  03/27/21 255 lb (115.7 kg)    Physical Exam Vitals reviewed.  Constitutional:      General: She is not in acute distress.    Appearance: Normal appearance. She is morbidly obese. She is not ill-appearing, toxic-appearing or diaphoretic.  HENT:     Head: Normocephalic and atraumatic.  Eyes:     General: No scleral icterus.       Right eye: No discharge.        Left eye: No discharge.     Conjunctiva/sclera: Conjunctivae normal.  Cardiovascular:     Rate and Rhythm: Normal rate.  Pulmonary:     Effort: Pulmonary effort is normal. No respiratory distress.  Musculoskeletal:        General: Normal range of motion.     Cervical back: Normal range of motion.  Skin:    General: Skin is warm and dry.     Capillary Refill: Capillary refill takes less than 2 seconds.  Neurological:     General: No focal deficit present.     Mental Status: She is alert and oriented to person, place, and time. Mental status is at baseline.  Psychiatric:        Mood and Affect: Mood normal.         Behavior: Behavior normal.        Thought Content: Thought content normal.        Judgment: Judgment normal.    Lab Results  Component Value Date   TSH 2.82 11/27/2020   Lab Results  Component Value Date   WBC 6.6 05/15/2021   HGB 12.6 05/15/2021   HCT 38.7 05/15/2021   MCV 89 05/15/2021   PLT 278 05/15/2021   Lab Results  Component Value Date   NA 138 05/15/2021   K 4.8 05/15/2021   CO2 22 05/15/2021   GLUCOSE 112 (H) 05/15/2021   BUN 11 05/15/2021   CREATININE 0.72 05/15/2021   BILITOT 0.3 05/15/2021   ALKPHOS 82 05/15/2021   AST 20 05/15/2021   ALT 18 05/15/2021   PROT 6.9 05/15/2021   ALBUMIN 4.5 05/15/2021   CALCIUM 9.1 05/15/2021   EGFR 92 05/15/2021   Lab Results  Component Value Date   CHOL 207 (H) 05/15/2021   Lab Results  Component Value Date   HDL 51 05/15/2021   Lab Results  Component Value Date   LDLCALC 137 (H) 05/15/2021   Lab Results  Component Value Date   TRIG 107 05/15/2021   Lab Results  Component Value Date   CHOLHDL 4.1 05/15/2021   Lab Results  Component Value Date   HGBA1C 6.2 05/15/2021

## 2021-12-01 ENCOUNTER — Other Ambulatory Visit: Payer: Self-pay

## 2021-12-01 NOTE — Progress Notes (Signed)
Labs abstracted 

## 2022-04-23 ENCOUNTER — Other Ambulatory Visit: Payer: Self-pay | Admitting: Family Medicine

## 2022-04-23 DIAGNOSIS — Z1231 Encounter for screening mammogram for malignant neoplasm of breast: Secondary | ICD-10-CM

## 2022-05-20 ENCOUNTER — Ambulatory Visit (INDEPENDENT_AMBULATORY_CARE_PROVIDER_SITE_OTHER): Payer: Commercial Managed Care - PPO | Admitting: Family Medicine

## 2022-05-20 ENCOUNTER — Encounter: Payer: Self-pay | Admitting: Family Medicine

## 2022-05-20 ENCOUNTER — Ambulatory Visit
Admission: RE | Admit: 2022-05-20 | Discharge: 2022-05-20 | Disposition: A | Discharge status: Home / Self Care | Payer: Commercial Managed Care - PPO | Source: Ambulatory Visit

## 2022-05-20 VITALS — BP 129/80 | HR 69 | Temp 97.0°F | Ht 65.0 in | Wt 244.0 lb

## 2022-05-20 DIAGNOSIS — Z0000 Encounter for general adult medical examination without abnormal findings: Secondary | ICD-10-CM

## 2022-05-20 DIAGNOSIS — Z1231 Encounter for screening mammogram for malignant neoplasm of breast: Secondary | ICD-10-CM

## 2022-05-20 DIAGNOSIS — Z6841 Body Mass Index (BMI) 40.0 and over, adult: Secondary | ICD-10-CM

## 2022-05-20 DIAGNOSIS — E559 Vitamin D deficiency, unspecified: Secondary | ICD-10-CM

## 2022-05-20 DIAGNOSIS — R7303 Prediabetes: Secondary | ICD-10-CM

## 2022-05-20 DIAGNOSIS — F411 Generalized anxiety disorder: Secondary | ICD-10-CM

## 2022-05-20 DIAGNOSIS — Z0001 Encounter for general adult medical examination with abnormal findings: Secondary | ICD-10-CM | POA: Diagnosis not present

## 2022-05-20 DIAGNOSIS — E785 Hyperlipidemia, unspecified: Secondary | ICD-10-CM

## 2022-05-20 DIAGNOSIS — F339 Major depressive disorder, recurrent, unspecified: Secondary | ICD-10-CM

## 2022-05-20 DIAGNOSIS — J302 Other seasonal allergic rhinitis: Secondary | ICD-10-CM

## 2022-05-20 LAB — BAYER DCA HB A1C WAIVED: HB A1C (BAYER DCA - WAIVED): 5.5 % (ref 4.8–5.6)

## 2022-05-20 MED ORDER — SIMVASTATIN 40 MG PO TABS: 40.0000 mg | ORAL_TABLET | Freq: Every day | ORAL | 1 refills | Status: DC

## 2022-05-20 MED ORDER — MONTELUKAST SODIUM 10 MG PO TABS: 10.0000 mg | ORAL_TABLET | Freq: Every day | ORAL | 1 refills | Status: DC

## 2022-05-20 MED ORDER — EZETIMIBE 10 MG PO TABS: 10.0000 mg | ORAL_TABLET | Freq: Every day | ORAL | 1 refills | Status: DC

## 2022-05-20 MED ORDER — TRAZODONE HCL 50 MG PO TABS: 50.0000 mg | ORAL_TABLET | Freq: Every day | ORAL | 1 refills | Status: DC

## 2022-05-20 MED ORDER — SERTRALINE HCL 100 MG PO TABS: 200.0000 mg | ORAL_TABLET | Freq: Every day | ORAL | 1 refills | Status: DC

## 2022-05-20 NOTE — Progress Notes (Signed)
Assessment & Plan:  Well adult exam Discussed health benefits of physical activity, and encouraged her to engage in regular exercise appropriate for her age and condition. Preventive health education provided. Declined Shingrix, Prevnar 20, and COVID booster. Mammogram scheduled for today.  Immunization History  Administered Date(s) Administered   Influenza, Quadrivalent, Recombinant, Inj, Pf 07/11/2019   Influenza,inj,Quad PF,6+ Mos 07/01/2018   Influenza-Unspecified 07/01/2018, 07/11/2019, 09/21/2020   Moderna Sars-Covid-2 Vaccination 09/21/2020   PFIZER(Purple Top)SARS-COV-2 Vaccination 12/10/2019, 12/31/2019   Pneumococcal Conjugate-13 11/06/2020   Pneumococcal Polysaccharide-23 06/29/2017   Tdap 04/01/2015   Health Maintenance  Topic Date Due   COVID-19 Vaccine (4 - Pfizer series) 06/05/2022 (Originally 11/16/2020)   Zoster Vaccines- Shingrix (1 of 2) 08/20/2022 (Originally 03/24/2005)   INFLUENZA VACCINE  01/03/2023 (Originally 05/05/2022)   Pneumonia Vaccine 26+ Years old (3 - PPSV23 or PCV20) 05/21/2023 (Originally 06/29/2022)   MAMMOGRAM  04/03/2023   TETANUS/TDAP  03/31/2025   COLONOSCOPY (Pts 45-63yr Insurance coverage will need to be confirmed)  07/15/2026   DEXA SCAN  Completed   Hepatitis C Screening  Completed   HPV VACCINES  Aged Out   - Lipid panel - CBC with Differential/Platelet - CMP14+EGFR  2. Vitamin D deficiency Labs to assess. - VITAMIN D 25 Hydroxy (Vit-D Deficiency, Fractures)  3. Dyslipidemia Labs to assess. - Lipid panel - CBC with Differential/Platelet - CMP14+EGFR - ezetimibe (ZETIA) 10 MG tablet; Take 1 tablet (10 mg total) by mouth daily.  Dispense: 90 tablet; Refill: 1 - simvastatin (ZOCOR) 40 MG tablet; Take 1 tablet (40 mg total) by mouth at bedtime.  Dispense: 90 tablet; Refill: 1  4. Prediabetes - Bayer DCA Hb A1c Waived  5. Chronic seasonal allergic rhinitis Well controlled on current regimen.  - montelukast (SINGULAIR) 10 MG  tablet; Take 1 tablet (10 mg total) by mouth at bedtime.  Dispense: 90 tablet; Refill: 1  6. [place] Well controlled on [place] regimen.  - CMP14+EGFR - sertraline (ZOLOFT) 100 MG tablet; Take 2 tablets (200 mg total) by mouth daily.  Dispense: 180 tablet; Refill: 1 - traZODone (DESYREL) 50 MG tablet; Take 1 tablet (50 mg total) by mouth at bedtime.  Dispense: 90 tablet; Refill: 1  7. Anxiety, generalized Well controlled on current regimen.  - CMP14+EGFR - sertraline (ZOLOFT) 100 MG tablet; Take 2 tablets (200 mg total) by mouth daily.  Dispense: 180 tablet; Refill: 1  8. Morbid obesity with BMI of 40.0-44.9, adult [place] Encouraged healthy eating and exercise.  - Lipid panel - CBC with Differential/Platelet - CMP14+EGFR - Bayer DCA Hb A1c Waived   Follow-up: Return in about 6 months (around 11/20/2022) for follow-up of chronic medication conditions.   BHendricks Limes MSN, APRN, FNP-C [facility]  Subjective:  [name] female    DOB: 6Jun 24, 1957 Age: 66y.o. MRN: [id number] Patient Care Team: JLoman Brooklyn FNP as PCP - General (Family Medicine) WMarylynn Pearson MD as Consulting Physician (Ophthalmology) TLavonna Monarch MD as Consulting Physician (Dermatology) TMichel Harrow DDahlonegaas Consulting Physician (Chiropractic Medicine)   CC:  Chief Complaint  Patient presents with   Annual Exam    HPI Jasmine Kim a 66y.o. female who presents today for a complete physical exam. She reports consuming a general diet. The patient does not participate in regular exercise at present. She generally feels well. She reports sleeping well. She does not have additional problems to discuss today.   Vision:appointment this afternoon Dental:Receives regular  dental care. Next appointment in October.  Advanced Directives Patient does not have advanced directives including DNR, living will, healthcare power of attorney,  financial power of attorney, and MOST form.   Vitamin D deficiency: taking a vitamin D supplement. Last vitamin D level slightly low at 29.5 in February 2022.   Dyslipidemia: taking Zetia and simvastatin daily.    Prediabetes: no medications. Last A1c 5.8 in February 2023.  Depression/Anxiety: patient states she is doing better. Denies s/s of bleeding with concomitant use of diclofenac.      05/20/2022    9:28 AM 11/19/2021    2:14 PM 05/15/2021   10:55 AM  Depression screen PHQ 2/9  Decreased Interest 0 1 0  Down, Depressed, Hopeless _0 PHQ - 2 Score _1 Altered sleeping 0 1 0  Tired, decreased energy _2 Change in appetite 0 0 1  Feeling bad or failure about yourself  0 0 0  Trouble concentrating 0 0 0  Moving slowly or fidgety/restless 0 0 0  Suicidal thoughts 0 0 0  PHQ-9 Score _3 Difficult doing work/chores Somewhat difficult Somewhat difficult Not difficult at all      05/20/2022    9:28 AM 11/19/2021    2:16 PM 05/15/2021   10:55 AM 12/19/2020    4:05 PM  GAD 7 : Generalized Anxiety Score  Nervous, Anxious, on Edge _4 Control/stop worrying 0 0 0 0  Worry too much - different things 0 1 0 0  Trouble relaxing 0 0 0 0  Restless 0 0 0 1  Easily annoyed or irritable _5 0  Afraid - awful might happen 0 0 0 0  Total GAD 7 Score _6 Anxiety Difficulty Somewhat difficult  Somewhat difficult Not difficult at all    Review of Systems  Constitutional:  Negative for chills, fever, malaise/fatigue and weight loss.  HENT:  Positive for congestion. Negative for ear discharge, ear pain, nosebleeds, sinus pain, sore throat and tinnitus.   Eyes:  Negative for blurred vision, double vision, pain, discharge and redness.  Respiratory:  Negative for cough, shortness of breath and wheezing.   Cardiovascular:  Negative for chest pain, palpitations and leg swelling.  Gastrointestinal:  Negative for abdominal pain, constipation, diarrhea, heartburn, nausea and  vomiting.  Genitourinary:  Negative for dysuria, frequency and urgency.  Musculoskeletal:  Negative for myalgias.  Skin:  Negative for rash.  Neurological:  Negative for dizziness, seizures, weakness and headaches.  Psychiatric/Behavioral:  Negative for depression, substance abuse and suicidal ideas. The patient is not nervous/anxious.     Current Outpatient Medications:    albuterol (VENTOLIN HFA) 108 (90 Base) MCG/ACT inhaler, SMARTSIG:2 Puff(s) By Mouth Every 4 Hours PRN, Disp: 18 g, Rfl: 2   aspirin EC 81 MG tablet, Take 81 mg by mouth daily., Disp: , Rfl:    brimonidine (ALPHAGAN) 0.15 % ophthalmic solution, 1 drop 3 (three) times daily., Disp: , Rfl:    Calcium Carbonate-Vit D-Min (CALCIUM 1200 PO), Take by mouth., Disp: , Rfl:    cholecalciferol (VITAMIN D3) 25 MCG (1000 UT) tablet, Take 2,000 Units by mouth daily., Disp: , Rfl:    clotrimazole (LOTRIMIN) 1 % cream, Apply 1 application topically as directed. To affected area, Disp: 30 g, Rfl: 2   diclofenac Sodium (VOLTAREN) 1 % GEL, Apply 2 g topically 4 (four) times daily., Disp: 50 g, Rfl: 2  ezetimibe (ZETIA) 10 MG tablet, Take 1 tablet (10 mg total) by mouth daily., Disp: 90 tablet, Rfl: 1   Homeopathic Products (ZICAM COLD REMEDY NA), Place into the nose., Disp: , Rfl:    ibuprofen (ADVIL) 600 MG tablet, Take 1 tablet (600 mg total) by mouth every 8 (eight) hours as needed., Disp: 30 tablet, Rfl: 0   montelukast (SINGULAIR) 10 MG tablet, Take 1 tablet (10 mg total) by mouth at bedtime., Disp: 90 tablet, Rfl: 1   sertraline (ZOLOFT) 100 MG tablet, Take 2 tablets (200 mg total) by mouth daily., Disp: 180 tablet, Rfl: 1   simvastatin (ZOCOR) 40 MG tablet, Take 1 tablet (40 mg total) by mouth at bedtime., Disp: 90 tablet, Rfl: 1   traZODone (DESYREL) 50 MG tablet, Take 1 tablet (50 mg total) by mouth at bedtime., Disp: 90 tablet, Rfl: 1   vitamin E 1000 UNIT capsule, Take 1,000 Units by mouth daily., Disp: , Rfl:   Allergies   Allergen Reactions   Procaine Shortness Of Breath   Niacin Hives    Past Medical History:  Diagnosis Date   Allergy    Bronchitis    Glaucoma    History of rabies vaccination 07/2019   run in with raccoon   Hyperlipidemia    Prediabetes 06/18/2020   Psoriasis     Past Surgical History:  Procedure Laterality Date   ABDOMINAL HYSTERECTOMY  1998    Family History  Problem Relation Age of Onset   Uterine cancer Mother    Pancreatic cancer Father    Cancer Brother        Unknown type   Heart disease Sister    Colon cancer Sister        50s   Diabetes Maternal Grandmother    Hypertension Maternal Grandmother    Arthritis Maternal Grandmother     Social History   Socioeconomic History   Marital status: Widowed    Spouse name: Not on file   Number of children: Not on file   Years of education: Not on file   Highest education level: Not on file  Occupational History   Not on file  Tobacco Use   Smoking status: Former   Smokeless tobacco: Never  Vaping Use   Vaping Use: Never used  Substance and Sexual Activity   Alcohol use: Yes    Comment: occ   Drug use: Never   Sexual activity: Not Currently  Other Topics Concern   Not on file  Social History Narrative   Not on file   Social Determinants of Health   Financial Resource Strain: Not on file  Food Insecurity: Not on file  Transportation Needs: Not on file  Physical Activity: Not on file  Stress: Not on file  Social Connections: Not on file  Intimate Partner Violence: Not on file      Objective:    BP 129/80   Pulse 69   Temp (!) 97 F (36.1 C) (Temporal)   Ht 5' 5" (1.651 m)   Wt 244 lb (110.7 kg)   SpO2 96%   BMI 40.60 kg/m   BP Readings from Last 3 Encounters:  05/20/22 129/80  11/19/21 132/79  05/15/21 134/81   Wt Readings from Last 3 Encounters:  05/20/22 244 lb (110.7 kg)  11/19/21 246 lb 6.4 oz (111.8 kg)  05/15/21 249 lb (112.9 kg)   Physical Exam Vitals reviewed.   Constitutional:      General: She is not in acute distress.      Appearance: Normal appearance. She is morbidly obese. She is not ill-appearing, toxic-appearing or diaphoretic.  HENT:     Head: Normocephalic and atraumatic.     Right Ear: Tympanic membrane, ear canal and external ear normal. There is no impacted cerumen.     Left Ear: Tympanic membrane, ear canal and external ear normal. There is no impacted cerumen.     Nose: Nose normal. No congestion or rhinorrhea.     Mouth/Throat:     Mouth: Mucous membranes are moist.     Pharynx: Oropharynx is clear. No oropharyngeal exudate or posterior oropharyngeal erythema.  Eyes:     General: No scleral icterus.       Right eye: No discharge.        Left eye: No discharge.     Conjunctiva/sclera: Conjunctivae normal.     Pupils: Pupils are equal, round, and reactive to light.  Cardiovascular:     Rate and Rhythm: Normal rate and regular rhythm.     Heart sounds: Normal heart sounds. No murmur heard.    No friction rub. No gallop.  Pulmonary:     Effort: Pulmonary effort is normal. No respiratory distress.     Breath sounds: Normal breath sounds. No stridor. No wheezing, rhonchi or rales.  Abdominal:     General: Abdomen is flat. Bowel sounds are normal. There is no distension.     Palpations: Abdomen is soft. There is no hepatomegaly, splenomegaly or mass.     Tenderness: There is no abdominal tenderness. There is no guarding or rebound.     Hernia: No hernia is present.  Musculoskeletal:        General: Normal range of motion.     Cervical back: Normal range of motion and neck supple. No rigidity. No muscular tenderness.  Lymphadenopathy:     Cervical: No cervical adenopathy.  Skin:    General: Skin is warm and dry.     Capillary Refill: Capillary refill takes less than 2 seconds.  Neurological:     General: No focal deficit present.     Mental Status: She is alert and oriented to person, place, and time. Mental status is at  baseline.  Psychiatric:        Mood and Affect: Mood normal.        Behavior: Behavior normal.        Thought Content: Thought content normal.        Judgment: Judgment normal.     Lab Results  Component Value Date   TSH 2.82 11/27/2020   Lab Results  Component Value Date   WBC 6.7 11/13/2021   HGB 13.1 11/13/2021   HCT 39 11/13/2021   MCV 89 05/15/2021   PLT 295 11/13/2021   Lab Results  Component Value Date   NA 138 11/13/2021   K 4.6 11/13/2021   CO2 20 11/13/2021   GLUCOSE 112 (H) 05/15/2021   BUN 11 05/15/2021   CREATININE 0.6 11/13/2021   BILITOT 0.3 05/15/2021   ALKPHOS 88 11/13/2021   AST 22 11/13/2021   ALT 20 11/13/2021   PROT 6.9 05/15/2021   ALBUMIN 4.3 11/13/2021   CALCIUM 9.4 11/13/2021   EGFR 97 11/13/2021   Lab Results  Component Value Date   CHOL 183 11/13/2021   Lab Results  Component Value Date   HDL 50 11/13/2021   Lab Results  Component Value Date   LDLCALC 109 11/13/2021   Lab Results  Component Value Date   TRIG 130 11/13/2021     Lab Results  Component Value Date   CHOLHDL 4.1 05/15/2021   Lab Results  Component Value Date   HGBA1C 5.8 11/13/2021

## 2022-05-21 LAB — CMP14+EGFR
ALT: 15 IU/L (ref 0–32)
AST: 17 IU/L (ref 0–40)
Albumin/Globulin Ratio: 1.8 (ref 1.2–2.2)
Albumin: 4.6 g/dL (ref 3.9–4.9)
Alkaline Phosphatase: 77 IU/L (ref 44–121)
BUN/Creatinine Ratio: 14 (ref 12–28)
BUN: 9 mg/dL (ref 8–27)
Bilirubin Total: 0.3 mg/dL (ref 0.0–1.2)
CO2: 19 mmol/L — ABNORMAL LOW (ref 20–29)
Calcium: 9.5 mg/dL (ref 8.7–10.3)
Chloride: 102 mmol/L (ref 96–106)
Creatinine, Ser: 0.65 mg/dL (ref 0.57–1.00)
Globulin, Total: 2.6 g/dL (ref 1.5–4.5)
Glucose: 100 mg/dL — ABNORMAL HIGH (ref 70–99)
Potassium: 4.7 mmol/L (ref 3.5–5.2)
Sodium: 140 mmol/L (ref 134–144)
Total Protein: 7.2 g/dL (ref 6.0–8.5)
eGFR: 96 mL/min/{1.73_m2} (ref >59)

## 2022-05-21 LAB — CBC WITH DIFFERENTIAL/PLATELET
Basophils Absolute: 0.1 10*3/uL (ref 0.0–0.2)
Basos: 1 %
EOS (ABSOLUTE): 0.3 10*3/uL (ref 0.0–0.4)
Eos: 4 %
Hematocrit: 38.9 % (ref 34.0–46.6)
Hemoglobin: 12.8 g/dL (ref 11.1–15.9)
Immature Grans (Abs): 0 10*3/uL (ref 0.0–0.1)
Immature Granulocytes: 0 %
Lymphocytes Absolute: 2 10*3/uL (ref 0.7–3.1)
Lymphs: 30 %
MCH: 30 pg (ref 26.6–33.0)
MCHC: 32.9 g/dL (ref 31.5–35.7)
MCV: 91 fL (ref 79–97)
Monocytes Absolute: 0.5 10*3/uL (ref 0.1–0.9)
Monocytes: 7 %
Neutrophils Absolute: 3.9 10*3/uL (ref 1.4–7.0)
Neutrophils: 58 %
Platelets: 284 10*3/uL (ref 150–450)
RBC: 4.26 x10E6/uL (ref 3.77–5.28)
RDW: 12.8 % (ref 11.7–15.4)
WBC: 6.8 10*3/uL (ref 3.4–10.8)

## 2022-05-21 LAB — LIPID PANEL
Chol/HDL Ratio: 3.9 ratio (ref 0.0–4.4)
Cholesterol, Total: 207 mg/dL — ABNORMAL HIGH (ref 100–199)
HDL: 53 mg/dL (ref >39)
LDL Chol Calc (NIH): 131 mg/dL — ABNORMAL HIGH (ref 0–99)
Triglycerides: 129 mg/dL (ref 0–149)
VLDL Cholesterol Cal: 23 mg/dL (ref 5–40)

## 2022-05-21 LAB — VITAMIN D 25 HYDROXY (VIT D DEFICIENCY, FRACTURES): Vit D, 25-Hydroxy: 35.9 ng/mL (ref 30.0–100.0)

## 2022-10-14 ENCOUNTER — Encounter: Payer: Self-pay | Admitting: *Deleted

## 2022-11-08 ENCOUNTER — Other Ambulatory Visit: Payer: Self-pay | Admitting: Family Medicine

## 2022-11-08 DIAGNOSIS — E785 Hyperlipidemia, unspecified: Secondary | ICD-10-CM

## 2022-11-10 ENCOUNTER — Encounter: Payer: Self-pay | Admitting: Family Medicine

## 2022-11-14 ENCOUNTER — Other Ambulatory Visit: Payer: Self-pay | Admitting: Family Medicine

## 2022-11-14 DIAGNOSIS — J302 Other seasonal allergic rhinitis: Secondary | ICD-10-CM

## 2022-11-25 ENCOUNTER — Ambulatory Visit: Payer: Commercial Managed Care - PPO | Admitting: Nurse Practitioner

## 2022-11-25 ENCOUNTER — Ambulatory Visit: Payer: Commercial Managed Care - PPO | Admitting: Family Medicine

## 2022-12-10 ENCOUNTER — Ambulatory Visit: Payer: Commercial Managed Care - PPO | Admitting: Family Medicine

## 2022-12-25 ENCOUNTER — Ambulatory Visit: Payer: Commercial Managed Care - PPO | Admitting: Family Medicine

## 2023-01-08 ENCOUNTER — Other Ambulatory Visit: Payer: Self-pay | Admitting: Family Medicine

## 2023-01-08 DIAGNOSIS — F339 Major depressive disorder, recurrent, unspecified: Secondary | ICD-10-CM

## 2023-01-08 DIAGNOSIS — F411 Generalized anxiety disorder: Secondary | ICD-10-CM

## 2023-01-21 ENCOUNTER — Ambulatory Visit (INDEPENDENT_AMBULATORY_CARE_PROVIDER_SITE_OTHER): Payer: Commercial Managed Care - PPO | Admitting: Family Medicine

## 2023-01-21 ENCOUNTER — Encounter: Payer: Self-pay | Admitting: Family Medicine

## 2023-01-21 VITALS — BP 120/75 | HR 69 | Temp 98.2°F | Ht 65.0 in | Wt 247.0 lb

## 2023-01-21 DIAGNOSIS — J302 Other seasonal allergic rhinitis: Secondary | ICD-10-CM

## 2023-01-21 DIAGNOSIS — L989 Disorder of the skin and subcutaneous tissue, unspecified: Secondary | ICD-10-CM

## 2023-01-21 DIAGNOSIS — R7303 Prediabetes: Secondary | ICD-10-CM | POA: Diagnosis not present

## 2023-01-21 DIAGNOSIS — E785 Hyperlipidemia, unspecified: Secondary | ICD-10-CM

## 2023-01-21 DIAGNOSIS — F411 Generalized anxiety disorder: Secondary | ICD-10-CM | POA: Diagnosis not present

## 2023-01-21 DIAGNOSIS — E559 Vitamin D deficiency, unspecified: Secondary | ICD-10-CM

## 2023-01-21 DIAGNOSIS — F339 Major depressive disorder, recurrent, unspecified: Secondary | ICD-10-CM

## 2023-01-21 DIAGNOSIS — Z6841 Body Mass Index (BMI) 40.0 and over, adult: Secondary | ICD-10-CM

## 2023-01-21 LAB — BAYER DCA HB A1C WAIVED: HB A1C (BAYER DCA - WAIVED): 6.1 % — ABNORMAL HIGH (ref 4.8–5.6)

## 2023-01-21 MED ORDER — SERTRALINE HCL 100 MG PO TABS: 200.0000 mg | ORAL_TABLET | Freq: Every day | ORAL | 0 refills | Status: DC

## 2023-01-21 NOTE — Patient Instructions (Signed)
Yoga with Adrienne

## 2023-01-21 NOTE — Progress Notes (Signed)
New Patient Office Visit  Subjective   [name], female    DOB: 05-09-1956  Age: 67 y.o. MRN: [id number]  CC:  Chief Complaint  Patient presents with   Establish Care    Medical management of chronic issues Spot left flank, new derm referral   HPI Jasmine Kim presents today to follow up chronic conditions She is also concerned for weight, anxiety, depression and a spot on her left flank   Skin lesion States that it has been there for at least 18 months, but is getting larger. It is darker than her other skin spots. Previously seen by Dermatology, but they retired. She needs a new referral   Anxiety/Depression  States that it "yo-yos" it is up and down. She lost her husband of 46 years in December of 2021 and still has grief. Has protective factors in her brother and her nephew. She states that her sister "means well" but sometimes makes things worse. 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   Feels that the sertraline is working.  States that she quit using the sleeping pill. States that she did not like taking it and it "did not agree with her"   Prediabetes  Has history of glaucoma. Sees Dr. Harlon Flor. Eye exam UTD. Denies eye changes.  Denies increase in thirst, urination.   Vitamin D deficiency  Takes Vitamin D regularly. States that she does not spend a lot of time outdoors.   Morbid obesity with BMI of 40.0-44.9, adult States that she knows what she needs to do, she just doesn't do it.  Expresses a lack of energy. She does not have the energy to workout at home. She continues to work so that she can keep her insurance.  Goes to chiropractor every 2 weeks and went to a seminar with him for health. States that her favorite form of exercise was a dance routine in the morning. States that this would be beneficial for her anxiety and obesity  Dyslipidemia  Denies any complain of any side effect. Reports 100% compliance   Fasting   Outpatient  Encounter Medications as of 01/21/2023  Medication Sig   albuterol (VENTOLIN HFA) 108 (90 Base) MCG/ACT inhaler SMARTSIG:2 Puff(s) By Mouth Every 4 Hours PRN   aspirin EC 81 MG tablet Take 81 mg by mouth daily.   b complex vitamins capsule Take 1 capsule by mouth daily.   brimonidine (ALPHAGAN) 0.15 % ophthalmic solution 1 drop 3 (three) times daily.   cholecalciferol (VITAMIN D3) 25 MCG (1000 UT) tablet Take 2,000 Units by mouth daily.   clotrimazole (LOTRIMIN) 1 % cream Apply 1 application topically as directed. To affected area   diclofenac Sodium (VOLTAREN) 1 % GEL Apply 2 g topically 4 (four) times daily.   ezetimibe (ZETIA) 10 MG tablet Take 1 tablet by mouth once daily   halobetasol (ULTRAVATE) 0.05 % cream Apply topically 2 (two) times daily.   ibuprofen (ADVIL) 600 MG tablet Take 1 tablet (600 mg total) by mouth every 8 (eight) hours as needed.   minocycline (DYNACIN) 50 MG tablet Take 50 mg by mouth 2 (two) times daily.   montelukast (SINGULAIR) 10 MG tablet TAKE 1 TABLET BY MOUTH AT BEDTIME   Multiple Vitamins-Minerals (WOMENS MULTIVITAMIN PO) Take by mouth.   sertraline (ZOLOFT) 100 MG tablet Take 2 tablets by mouth once daily   simvastatin (ZOCOR) 40 MG tablet Take 1 tablet (40 mg total) by mouth at bedtime.   [DISCONTINUED] Calcium Carbonate-Vit  D-Min (CALCIUM 1200 PO) Take by mouth.   [DISCONTINUED] Homeopathic Products (ZICAM COLD REMEDY NA) Place into the nose.   [DISCONTINUED] traZODone (DESYREL) 50 MG tablet Take 1 tablet (50 mg total) by mouth at bedtime.   [DISCONTINUED] vitamin E 1000 UNIT capsule Take 1,000 Units by mouth daily.   No facility-administered encounter medications on file as of 01/21/2023.    Past Medical History:  Diagnosis Date   Allergy    Bronchitis    Glaucoma    History of rabies vaccination 07/2019   run in with raccoon   Hyperlipidemia    Prediabetes 06/18/2020   Psoriasis     Past Surgical History:  Procedure Laterality Date    ABDOMINAL HYSTERECTOMY  1998    Family History  Problem Relation Age of Onset   Uterine cancer Mother    Pancreatic cancer Father    Cancer Brother        Unknown type   Heart disease Sister    Colon cancer Sister        26s   Diabetes Maternal Grandmother    Hypertension Maternal Grandmother    Arthritis Maternal Grandmother     Social History   Socioeconomic History   Marital status: Widowed    Spouse name: Not on file   Number of children: Not on file   Years of education: Not on file   Highest education level: Associate degree: academic program  Occupational History   Not on file  Tobacco Use   Smoking status: Former   Smokeless tobacco: Never  Vaping Use   Vaping Use: Never used  Substance and Sexual Activity   Alcohol use: Yes    Comment: occ   Drug use: Never   Sexual activity: Not Currently  Other Topics Concern   Not on file  Social History Narrative   Not on file   Social Determinants of Health   Financial Resource Strain: Not on file  Food Insecurity: No Food Insecurity (12/24/2022)   Hunger Vital Sign    Worried About Running Out of Food in the Last Year: Never true    Ran Out of Food in the Last Year: Never true  Transportation Needs: No Transportation Needs (12/24/2022)   PRAPARE - Administrator, Civil Service (Medical): No    Lack of Transportation (Non-Medical): No  Physical Activity: Insufficiently Active (12/24/2022)   Exercise Vital Sign    Days of Exercise per Week: 2 days    Minutes of Exercise per Session: 20 min  Stress: Stress Concern Present (12/24/2022)   [employer] of Occupational Health - Occupational Stress Questionnaire    Feeling of Stress : To some extent  Social Connections: Moderately Integrated (12/24/2022)   Social Connection and Isolation Panel [NHANES]    Frequency of Communication with Friends and Family: More than three times a week    Frequency of Social Gatherings with Friends and Family: Twice a  week    Attends Religious Services: 1 to 4 times per year    Active Member of [employer] or Organizations: Yes    Attends Banker Meetings: More than 4 times per year    Marital Status: Widowed  Intimate Partner Violence: Not on file    ROS As per HPI  Objective   BP 120/75   Pulse 69   Temp 98.2 F (36.8 C)   Ht  (1.651 m)   Wt 247 lb (112 kg)   SpO2 96%   BMI 41.10  kg/m   Physical Exam Constitutional:      General: She is awake. She is not in acute distress.    Appearance: Normal appearance. She is well-developed and well-groomed. She is obese. She is not ill-appearing, toxic-appearing or diaphoretic.  Cardiovascular:     Rate and Rhythm: Normal rate and regular rhythm.     Pulses: Normal pulses.     Heart sounds: Normal heart sounds. No murmur heard.    No gallop.  Pulmonary:     Effort: Pulmonary effort is normal. No tachypnea, bradypnea or respiratory distress.     Breath sounds: Normal breath sounds. No stridor, decreased air movement or transmitted upper airway sounds. No decreased breath sounds, wheezing, rhonchi or rales.  Musculoskeletal:     Right lower leg: No edema.     Left lower leg: No edema.  Skin:    General: Skin is warm.     Capillary Refill: Capillary refill takes less than 2 seconds.     Comments: Small circular raised nevi with discoloration. Undefined borders, growing, raised  Neurological:     General: No focal deficit present.     Mental Status: She is alert and oriented to person, place, and time. Mental status is at baseline.     Motor: No weakness.  Psychiatric:        Mood and Affect: Mood normal.        Behavior: Behavior normal.        Thought Content: Thought content normal.        Judgment: Judgment normal.       01/21/2023    8:33 AM 05/20/2022    9:28 AM 11/19/2021    2:14 PM  Depression screen PHQ 2/9  Decreased Interest 0 0 1  Down, Depressed, Hopeless 1 1 1   PHQ - 2 Score 1 1 2   Altered sleeping 0 0 1   Tired, decreased energy 1 1 1   Change in appetite 1 0 0  Feeling bad or failure about yourself  0 0 0  Trouble concentrating 1 0 0  Moving slowly or fidgety/restless 0 0 0  Suicidal thoughts 0 0 0  PHQ-9 Score 4 2 4   Difficult doing work/chores Not difficult at all Somewhat difficult Somewhat difficult      01/21/2023    8:34 AM 05/20/2022    9:28 AM 11/19/2021    2:16 PM 05/15/2021   10:55 AM  GAD 7 : Generalized Anxiety Score  Nervous, Anxious, on Edge 1 1 1 1   Control/stop worrying 0 0 0 0  Worry too much - different things 1 0 1 0  Trouble relaxing 0 0 0 0  Restless 0 0 0 0  Easily annoyed or irritable 1 1 1 1   Afraid - awful might happen 0 0 0 0  Total GAD 7 Score 3 2 3 2   Anxiety Difficulty Somewhat difficult Somewhat difficult  Somewhat difficult   Assessment & Plan:  1. Prediabetes A1C 6.1% today. Reviewed with patient in clinic. Discussed continuing lifestyle modifications. Labs as below. Will communicate results to patient once available.  - CMP14+EGFR - CBC with Differential/Platelet - Bayer DCA Hb A1c Waived  2. Depression, recurrent Well controlled. Refill as below.  - sertraline (ZOLOFT) 100 MG tablet; Take 2 tablets (200 mg total) by mouth daily.  Dispense: 180 tablet; Refill: 0  3. Anxiety, generalized Labs as below. Will communicate results to patient once available.  Well controlled. Refill as below.  - TSH - sertraline (ZOLOFT) 100  MG tablet; Take 2 tablets (200 mg total) by mouth daily.  Dispense: 180 tablet; Refill: 0  4. Morbid obesity with BMI of 40.0-44.9, adult She is trying to exercise more frequently. She has several resources at home, she states that she just needs to find the motivation to do it. Declined referral to nutrition at this time because she is following with chiropractor and is completing health educational seminars with them.  Discussed with patient to continue healthy lifestyle choices, including diet (rich in fruits, vegetables,  and lean proteins, and low in salt and simple carbohydrates) and exercise (at least 30 minutes of moderate physical activity daily). Limit beverages high is sugar. Recommended at least 80-100 oz of water daily.   5. Vitamin D deficiency Labs as below. Will communicate results to patient once available.  - VITAMIN D 25 Hydroxy (Vit-D Deficiency, Fractures)  6. Dyslipidemia Labs as below. Will communicate results to patient once available. Continue on current regimen. Previous labs within normal limits.  - Lipid panel  7. Skin lesion of back Patient previously established with Dermatologist who retired. Needs to be seen for changing mole. Instructed patient to call back if she cannot get in quickly with Derm.  - Ambulatory referral to Dermatology  8. Chronic seasonal allergic rhinitis Patient is managing with OTC regimens. Does not wish to change regimen.   The above assessment and management plan was discussed with the patient. The patient verbalized understanding of and has agreed to the management plan using shared-decision making. Patient is aware to call the clinic if they develop any new symptoms or if symptoms fail to improve or worsen. Patient is aware when to return to the clinic for a follow-up visit. Patient educated on when it is appropriate to go to the emergency department.    Neale Burly, DNP-FNP [facility] [address] [place], [place] [address] [phone]

## 2023-01-22 LAB — TSH: TSH: 2.33 u[IU]/mL (ref 0.450–4.500)

## 2023-01-22 LAB — CBC WITH DIFFERENTIAL/PLATELET
Basophils Absolute: 0.1 10*3/uL (ref 0.0–0.2)
Basos: 1 %
EOS (ABSOLUTE): 0.3 10*3/uL (ref 0.0–0.4)
Eos: 4 %
Hematocrit: 37.1 % (ref 34.0–46.6)
Hemoglobin: 12.3 g/dL (ref 11.1–15.9)
Immature Grans (Abs): 0 10*3/uL (ref 0.0–0.1)
Immature Granulocytes: 1 %
Lymphocytes Absolute: 2.1 10*3/uL (ref 0.7–3.1)
Lymphs: 33 %
MCH: 29.6 pg (ref 26.6–33.0)
MCHC: 33.2 g/dL (ref 31.5–35.7)
MCV: 89 fL (ref 79–97)
Monocytes Absolute: 0.4 10*3/uL (ref 0.1–0.9)
Monocytes: 7 %
Neutrophils Absolute: 3.5 10*3/uL (ref 1.4–7.0)
Neutrophils: 54 %
Platelets: 289 10*3/uL (ref 150–450)
RBC: 4.15 x10E6/uL (ref 3.77–5.28)
RDW: 12.5 % (ref 11.7–15.4)
WBC: 6.4 10*3/uL (ref 3.4–10.8)

## 2023-01-22 LAB — CMP14+EGFR
ALT: 16 IU/L (ref 0–32)
AST: 21 IU/L (ref 0–40)
Albumin/Globulin Ratio: 1.5 (ref 1.2–2.2)
Albumin: 3.9 g/dL (ref 3.9–4.9)
Alkaline Phosphatase: 74 IU/L (ref 44–121)
BUN/Creatinine Ratio: 13 (ref 12–28)
BUN: 9 mg/dL (ref 8–27)
Bilirubin Total: <0.2 mg/dL (ref 0.0–1.2)
CO2: 20 mmol/L (ref 20–29)
Calcium: 8.7 mg/dL (ref 8.7–10.3)
Chloride: 105 mmol/L (ref 96–106)
Creatinine, Ser: 0.72 mg/dL (ref 0.57–1.00)
Globulin, Total: 2.6 g/dL (ref 1.5–4.5)
Glucose: 117 mg/dL — ABNORMAL HIGH (ref 70–99)
Potassium: 4.1 mmol/L (ref 3.5–5.2)
Sodium: 141 mmol/L (ref 134–144)
Total Protein: 6.5 g/dL (ref 6.0–8.5)
eGFR: 92 mL/min/{1.73_m2} (ref >59)

## 2023-01-22 LAB — LIPID PANEL
Chol/HDL Ratio: 3.4 ratio (ref 0.0–4.4)
Cholesterol, Total: 187 mg/dL (ref 100–199)
HDL: 55 mg/dL (ref >39)
LDL Chol Calc (NIH): 114 mg/dL — ABNORMAL HIGH (ref 0–99)
Triglycerides: 97 mg/dL (ref 0–149)
VLDL Cholesterol Cal: 18 mg/dL (ref 5–40)

## 2023-01-22 LAB — VITAMIN D 25 HYDROXY (VIT D DEFICIENCY, FRACTURES): Vit D, 25-Hydroxy: 28.6 ng/mL — ABNORMAL LOW (ref 30.0–100.0)

## 2023-02-07 ENCOUNTER — Other Ambulatory Visit: Payer: Self-pay | Admitting: Family Medicine

## 2023-02-07 DIAGNOSIS — E785 Hyperlipidemia, unspecified: Secondary | ICD-10-CM

## 2023-02-14 ENCOUNTER — Other Ambulatory Visit: Payer: Self-pay | Admitting: Family Medicine

## 2023-02-14 DIAGNOSIS — J302 Other seasonal allergic rhinitis: Secondary | ICD-10-CM

## 2023-02-25 ENCOUNTER — Other Ambulatory Visit: Payer: Self-pay | Admitting: Family Medicine

## 2023-02-25 DIAGNOSIS — F411 Generalized anxiety disorder: Secondary | ICD-10-CM

## 2023-02-25 DIAGNOSIS — F339 Major depressive disorder, recurrent, unspecified: Secondary | ICD-10-CM

## 2023-04-22 ENCOUNTER — Ambulatory Visit (INDEPENDENT_AMBULATORY_CARE_PROVIDER_SITE_OTHER): Payer: Commercial Managed Care - PPO | Admitting: Family Medicine

## 2023-04-22 ENCOUNTER — Encounter: Payer: Self-pay | Admitting: Family Medicine

## 2023-04-22 VITALS — BP 123/76 | HR 71 | Temp 98.2°F | Ht 65.0 in | Wt 251.0 lb

## 2023-04-22 DIAGNOSIS — E559 Vitamin D deficiency, unspecified: Secondary | ICD-10-CM | POA: Diagnosis not present

## 2023-04-22 DIAGNOSIS — F411 Generalized anxiety disorder: Secondary | ICD-10-CM | POA: Diagnosis not present

## 2023-04-22 DIAGNOSIS — R7303 Prediabetes: Secondary | ICD-10-CM

## 2023-04-22 DIAGNOSIS — Z6841 Body Mass Index (BMI) 40.0 and over, adult: Secondary | ICD-10-CM

## 2023-04-22 DIAGNOSIS — F339 Major depressive disorder, recurrent, unspecified: Secondary | ICD-10-CM

## 2023-04-22 DIAGNOSIS — E785 Hyperlipidemia, unspecified: Secondary | ICD-10-CM

## 2023-04-22 DIAGNOSIS — R252 Cramp and spasm: Secondary | ICD-10-CM

## 2023-04-22 LAB — BAYER DCA HB A1C WAIVED: HB A1C (BAYER DCA - WAIVED): 6.1 % — ABNORMAL HIGH (ref 4.8–5.6)

## 2023-04-22 MED ORDER — SERTRALINE HCL 100 MG PO TABS: 200.0000 mg | ORAL_TABLET | Freq: Every day | ORAL | 1 refills | Status: DC

## 2023-04-22 NOTE — Progress Notes (Signed)
Acute Office Visit  Subjective:  [name], female    DOB: Dec 16, 1955, 67 y.o.   MRN: [id number]  Chief Complaint  Patient presents with   Medical Management of Chronic Issues   HPI Patient is in today for management of chronic issues  1. Prediabetes Has not started checking BG at home. States that she has not complied with diet and exercise recommendations.   2. Depression, recurrent (HCC) 3. Anxiety, generalized States that she is controlled most of the time. Reports stress at work that is causing increased anxiety. Denies thoughts of SI.   4. Vitamin D deficiency She is taking supplement. Continues with fatigue.  Endorses cramping in her foot. Denies numbness, tingling in extremities.   5. Dyslipidemia Continues to take zetia. Denies side effects. Reports noncompliance with diet and exercise.   6. Morbid obesity with BMI of 40.0-44.9, adult (HCC) States that she has backslide slightly since last visit. She reports that she is not watching her diet as well. States that she is stressed with work and the heat. She is trying to get back into exercise. Reports that she is moving more at work because they are short-staffed. Continues with chiropractor.   7. Leg cramping  States that it started flaring in the last 5-6 weeks. Happens on and off. Has "several" per week.   ROS As per HPI   Objective:  BP 123/76   Pulse 71   Temp 98.2 F (36.8 C)   Ht 5\' 5"  (1.651 m)   Wt 251 lb (113.9 kg)   SpO2 96%   BMI 41.77 kg/m   Physical Exam Constitutional:      General: She is awake. She is not in acute distress.    Appearance: Normal appearance. She is well-developed and well-groomed. She is obese. She is not ill-appearing, toxic-appearing or diaphoretic.  Cardiovascular:     Rate and Rhythm: Normal rate and regular rhythm.     Pulses: Normal pulses.          Radial pulses are 2+ on the right side and 2+ on the left side.       Posterior tibial pulses are 2+  on the right side and 2+ on the left side.     Heart sounds: Normal heart sounds. No murmur heard.    No gallop.  Pulmonary:     Effort: Pulmonary effort is normal. No respiratory distress.     Breath sounds: Normal breath sounds. No stridor. No wheezing, rhonchi or rales.  Musculoskeletal:     Cervical back: Full passive range of motion without pain and neck supple.     Right lower leg: No edema.     Left lower leg: No edema.  Skin:    General: Skin is warm.     Capillary Refill: Capillary refill takes less than 2 seconds.  Neurological:     General: No focal deficit present.     Mental Status: She is alert, oriented to person, place, and time and easily aroused. Mental status is at baseline.     GCS: GCS eye subscore is 4. GCS verbal subscore is 5. GCS motor subscore is 6.     Motor: No weakness.  Psychiatric:        Attention and Perception: Attention and perception normal.        Mood and Affect: Mood and affect normal.        Speech: Speech normal.        Behavior: Behavior normal.  Behavior is cooperative.        Thought Content: Thought content normal. Thought content does not include homicidal or suicidal ideation. Thought content does not include homicidal or suicidal plan.        Cognition and Memory: Cognition and memory normal.        Judgment: Judgment normal.       04/22/2023    8:12 AM 01/21/2023    8:33 AM 05/20/2022    9:28 AM  Depression screen PHQ 2/9  Decreased Interest 0 0 0  Down, Depressed, Hopeless 1 1 1   PHQ - 2 Score 1 1 1   Altered sleeping 1 0 0  Tired, decreased energy 1 1 1   Change in appetite 0 1 0  Feeling bad or failure about yourself  0 0 0  Trouble concentrating 0 1 0  Moving slowly or fidgety/restless 0 0 0  Suicidal thoughts 0 0 0  PHQ-9 Score 3 4 2   Difficult doing work/chores Not difficult at all Not difficult at all Somewhat difficult      04/22/2023    8:12 AM 01/21/2023    8:34 AM 05/20/2022    9:28 AM 11/19/2021    2:16 PM  GAD 7  : Generalized Anxiety Score  Nervous, Anxious, on Edge 1 1 1 1   Control/stop worrying 0 0 0 0  Worry too much - different things 0 1 0 1  Trouble relaxing 0 0 0 0  Restless 1 0 0 0  Easily annoyed or irritable 0 1 1 1   Afraid - awful might happen 0 0 0 0  Total GAD 7 Score 2 3 2 3   Anxiety Difficulty Not difficult at all Somewhat difficult Somewhat difficult     Assessment & Plan:  1. Prediabetes Discussed healthy lifestyle changes. Labs as below. Will communicate results to patient once available. Will consider starting metformin based on results.  - CMP14+EGFR - Bayer DCA Hb A1c Waived - CBC with Differential/Platelet  2. Depression, recurrent (HCC) Well controlled. Refill as below. Safety contract established. Denies SI.  - sertraline (ZOLOFT) 100 MG tablet; Take 2 tablets (200 mg total) by mouth daily.  Dispense: 180 tablet; Refill: 1  3. Anxiety, generalized Well controlled. Refill as below. Safety contract established. Denies SI.  - sertraline (ZOLOFT) 100 MG tablet; Take 2 tablets (200 mg total) by mouth daily.  Dispense: 180 tablet; Refill: 1  4. Vitamin D deficiency Labs as below. Will communicate results to patient once available.  Continue supplementation  - Vitamin D, 25-hydroxy  5. Dyslipidemia Labs as below. Will communicate results to patient once available.  Fasting status unknown - Lipid panel  6. Morbid obesity with BMI of 40.0-44.9, adult [facility]) Discussed with patient to continue healthy lifestyle choices, including diet (rich in fruits, vegetables, and lean proteins, and low in salt and simple carbohydrates) and exercise (at least 30 minutes of moderate physical activity daily). Limit beverages high is sugar. Recommended at least 80-100 oz of water daily.   7. Leg cramping Labs as below. Will communicate results to patient once available.  - Magnesium - Vitamin B12   The above assessment and management plan was discussed with the patient. The patient  verbalized understanding of and has agreed to the management plan using shared-decision making. Patient is aware to call the clinic if they develop any new symptoms or if symptoms fail to improve or worsen. Patient is aware when to return to the clinic for a follow-up visit. Patient educated on when  it is appropriate to go to the emergency department.   Return in about 6 months (around 10/23/2023) for Chronic Condition Follow up.  Neale Burly, DNP-FNP [facility] [address] [place], [place] [address] [phone]

## 2023-04-23 LAB — CBC WITH DIFFERENTIAL/PLATELET
Basophils Absolute: 0.1 10*3/uL (ref 0.0–0.2)
Basos: 1 %
EOS (ABSOLUTE): 0.2 10*3/uL (ref 0.0–0.4)
Eos: 4 %
Hematocrit: 37.1 % (ref 34.0–46.6)
Hemoglobin: 12 g/dL (ref 11.1–15.9)
Immature Grans (Abs): 0 10*3/uL (ref 0.0–0.1)
Immature Granulocytes: 0 %
Lymphocytes Absolute: 2.1 10*3/uL (ref 0.7–3.1)
Lymphs: 32 %
MCH: 28.6 pg (ref 26.6–33.0)
MCHC: 32.3 g/dL (ref 31.5–35.7)
MCV: 89 fL (ref 79–97)
Monocytes Absolute: 0.5 10*3/uL (ref 0.1–0.9)
Monocytes: 7 %
Neutrophils Absolute: 3.7 10*3/uL (ref 1.4–7.0)
Neutrophils: 56 %
Platelets: 254 10*3/uL (ref 150–450)
RBC: 4.19 x10E6/uL (ref 3.77–5.28)
RDW: 13 % (ref 11.7–15.4)
WBC: 6.6 10*3/uL (ref 3.4–10.8)

## 2023-04-23 LAB — CMP14+EGFR
ALT: 14 IU/L (ref 0–32)
AST: 18 IU/L (ref 0–40)
Albumin: 4.1 g/dL (ref 3.9–4.9)
Alkaline Phosphatase: 77 IU/L (ref 44–121)
BUN/Creatinine Ratio: 13 (ref 12–28)
BUN: 9 mg/dL (ref 8–27)
Bilirubin Total: 0.3 mg/dL (ref 0.0–1.2)
CO2: 21 mmol/L (ref 20–29)
Calcium: 9 mg/dL (ref 8.7–10.3)
Chloride: 102 mmol/L (ref 96–106)
Creatinine, Ser: 0.68 mg/dL (ref 0.57–1.00)
Globulin, Total: 2.7 g/dL (ref 1.5–4.5)
Glucose: 116 mg/dL — ABNORMAL HIGH (ref 70–99)
Potassium: 4.2 mmol/L (ref 3.5–5.2)
Sodium: 138 mmol/L (ref 134–144)
Total Protein: 6.8 g/dL (ref 6.0–8.5)
eGFR: 95 mL/min/{1.73_m2} (ref >59)

## 2023-04-23 LAB — LIPID PANEL
Chol/HDL Ratio: 3.2 ratio (ref 0.0–4.4)
Cholesterol, Total: 172 mg/dL (ref 100–199)
HDL: 54 mg/dL (ref >39)
LDL Chol Calc (NIH): 103 mg/dL — ABNORMAL HIGH (ref 0–99)
Triglycerides: 79 mg/dL (ref 0–149)
VLDL Cholesterol Cal: 15 mg/dL (ref 5–40)

## 2023-04-23 LAB — VITAMIN B12: Vitamin B-12: 242 pg/mL (ref 232–1245)

## 2023-04-23 LAB — VITAMIN D 25 HYDROXY (VIT D DEFICIENCY, FRACTURES): Vit D, 25-Hydroxy: 26 ng/mL — ABNORMAL LOW (ref 30.0–100.0)

## 2023-04-23 LAB — MAGNESIUM: Magnesium: 1.9 mg/dL (ref 1.6–2.3)

## 2023-04-28 MED ORDER — VITAMIN D (ERGOCALCIFEROL) 1.25 MG (50000 UNIT) PO CAPS
50000.0000 [IU] | ORAL_CAPSULE | ORAL | 0 refills | Status: AC
Start: 2023-04-28 — End: 2023-05-27

## 2023-04-28 NOTE — Progress Notes (Signed)
Vitamin D remains slightly low. Given that she is taking supplementation, will send in weekly dose for her to take for the next 5 weeks. She can then resume daily dose. LDL Cholesterol is slightly elevated. Diet encouraged - increase intake of fresh fruits and vegetables, increase intake of lean proteins. Bake, broil, or grill foods. Avoid fried, greasy, and fatty foods. Avoid fast foods. Increase intake of fiber-rich whole grains. Exercise encouraged - at least 150 minutes per week and advance as tolerated. Patient is on simvastatin, if willing would like to switch to crestor (rosuvastatin). Remains in prediabetes range, encourage diet and lifestyle modifications.   The 10-year ASCVD risk score (Arnett DK, et al., 2019) is: 6.7%   Values used to calculate the score:     Age: 67 years     Sex: Female     Is Non-Hispanic African American: No     Diabetic: No     Tobacco smoker: No     Systolic Blood Pressure: 123 mmHg     Is BP treated: No     HDL Cholesterol: 54 mg/dL     Total Cholesterol: 172 mg/dL

## 2023-04-28 NOTE — Addendum Note (Signed)
Addended by: Neale Burly on: 04/28/2023 03:04 PM   Modules accepted: Orders

## 2023-05-08 ENCOUNTER — Other Ambulatory Visit: Payer: Self-pay | Admitting: Family Medicine

## 2023-05-08 DIAGNOSIS — E785 Hyperlipidemia, unspecified: Secondary | ICD-10-CM

## 2023-05-19 ENCOUNTER — Other Ambulatory Visit: Payer: Self-pay | Admitting: Family Medicine

## 2023-05-19 DIAGNOSIS — J302 Other seasonal allergic rhinitis: Secondary | ICD-10-CM

## 2023-06-08 ENCOUNTER — Other Ambulatory Visit: Payer: Self-pay | Admitting: Family Medicine

## 2023-06-08 DIAGNOSIS — Z1231 Encounter for screening mammogram for malignant neoplasm of breast: Secondary | ICD-10-CM

## 2023-06-22 ENCOUNTER — Ambulatory Visit
Admission: RE | Admit: 2023-06-22 | Discharge: 2023-06-22 | Disposition: A | Discharge status: Home / Self Care | Payer: Commercial Managed Care - PPO | Source: Ambulatory Visit

## 2023-06-22 DIAGNOSIS — Z1231 Encounter for screening mammogram for malignant neoplasm of breast: Secondary | ICD-10-CM

## 2023-06-23 NOTE — Progress Notes (Signed)
Normal mammogram. Repeat screening in one year.

## 2023-10-21 ENCOUNTER — Ambulatory Visit: Payer: Commercial Managed Care - PPO | Admitting: Family Medicine

## 2023-11-14 ENCOUNTER — Other Ambulatory Visit: Payer: Self-pay | Admitting: Family Medicine

## 2023-11-14 DIAGNOSIS — E785 Hyperlipidemia, unspecified: Secondary | ICD-10-CM

## 2023-11-20 ENCOUNTER — Other Ambulatory Visit: Payer: Self-pay | Admitting: Family Medicine

## 2023-11-20 DIAGNOSIS — J302 Other seasonal allergic rhinitis: Secondary | ICD-10-CM

## 2023-11-21 ENCOUNTER — Other Ambulatory Visit: Payer: Self-pay | Admitting: Family Medicine

## 2023-11-21 DIAGNOSIS — F411 Generalized anxiety disorder: Secondary | ICD-10-CM

## 2023-11-21 DIAGNOSIS — F339 Major depressive disorder, recurrent, unspecified: Secondary | ICD-10-CM

## 2023-11-25 ENCOUNTER — Ambulatory Visit: Payer: Commercial Managed Care - PPO | Admitting: Family Medicine

## 2023-12-01 ENCOUNTER — Ambulatory Visit: Payer: Commercial Managed Care - PPO

## 2023-12-11 LAB — LAB REPORT - SCANNED
A1c: 5.8
EGFR: 95
TSH: 3.16 (ref 0.41–5.90)

## 2023-12-16 ENCOUNTER — Ambulatory Visit (INDEPENDENT_AMBULATORY_CARE_PROVIDER_SITE_OTHER): Payer: Commercial Managed Care - PPO | Admitting: Family Medicine

## 2023-12-16 ENCOUNTER — Encounter: Payer: Self-pay | Admitting: Family Medicine

## 2023-12-16 VITALS — BP 125/80 | HR 80 | Temp 98.0°F | Ht 65.0 in | Wt 247.0 lb

## 2023-12-16 DIAGNOSIS — F339 Major depressive disorder, recurrent, unspecified: Secondary | ICD-10-CM

## 2023-12-16 DIAGNOSIS — J302 Other seasonal allergic rhinitis: Secondary | ICD-10-CM

## 2023-12-16 DIAGNOSIS — E785 Hyperlipidemia, unspecified: Secondary | ICD-10-CM

## 2023-12-16 DIAGNOSIS — E559 Vitamin D deficiency, unspecified: Secondary | ICD-10-CM

## 2023-12-16 DIAGNOSIS — R7303 Prediabetes: Secondary | ICD-10-CM | POA: Diagnosis not present

## 2023-12-16 DIAGNOSIS — M25441 Effusion, right hand: Secondary | ICD-10-CM

## 2023-12-16 DIAGNOSIS — Z6841 Body Mass Index (BMI) 40.0 and over, adult: Secondary | ICD-10-CM

## 2023-12-16 DIAGNOSIS — F411 Generalized anxiety disorder: Secondary | ICD-10-CM

## 2023-12-16 MED ORDER — BUSPIRONE HCL 5 MG PO TABS: 5.0000 mg | ORAL_TABLET | Freq: Two times a day (BID) | ORAL | 2 refills | Status: DC

## 2023-12-16 MED ORDER — VITAMIN D (ERGOCALCIFEROL) 1.25 MG (50000 UNIT) PO CAPS: 50000.0000 [IU] | ORAL_CAPSULE | ORAL | 0 refills | Status: AC

## 2023-12-16 MED ORDER — NAPROXEN 500 MG PO TABS: 500.0000 mg | ORAL_TABLET | Freq: Two times a day (BID) | ORAL | 0 refills | Status: DC

## 2023-12-16 MED ORDER — ROSUVASTATIN CALCIUM 40 MG PO TABS: 40.0000 mg | ORAL_TABLET | Freq: Every day | ORAL | 0 refills | Status: DC

## 2023-12-16 MED ORDER — SERTRALINE HCL 100 MG PO TABS: 200.0000 mg | ORAL_TABLET | Freq: Every day | ORAL | 1 refills | Status: DC

## 2023-12-16 NOTE — Progress Notes (Signed)
 Subjective:  [name], female    DOB: 1955/10/08, 68 y.o.   MRN: [id number]  Patient Care Team: Arrie Senate, FNP as PCP - General (Family Medicine) Chalmers Guest, MD as Consulting Physician (Ophthalmology) Janalyn Harder, MD (Inactive) as Consulting Physician (Dermatology) Sandford Craze, DC as Consulting Physician (Chiropractic Medicine)   Chief Complaint:  Medical Management of Chronic Issues (Discuss weight loss/labwork)   HPI: Jasmine Kim is a 68 y.o. female presenting on 12/16/2023 for Medical Management of Chronic Issues (Discuss weight loss/labwork)  HPI Morbid obesity with BMI of 40.0-44.9, adult (HCC) (Primary) States that her eating habits worsened due to stress at work, but that season of testing has ended. In addition, she is working on house and is not cooking at home as much. She is increasing her water intake. Reports that she is not really exercising, but is hopeful to restart now that it is warmer.   Vitamin D deficiency, follow-up  Lab Results  Component Value Date   VD25OH 26.0 (L) 04/22/2023   VD25OH 28.6 (L) 01/21/2023   VD25OH 35.9 05/20/2022   CALCIUM 9.0 04/22/2023   CALCIUM 8.7 01/21/2023        Wt Readings from Last 3 Encounters:  12/16/23 247 lb (112 kg)  04/22/23 251 lb (113.9 kg)  01/21/23 247 lb (112 kg)    She was last seen for vitamin D deficiency 3 months ago.  Management since that visit includes OTC supplement. She reports good compliance with treatment. She is not having side effects.   Symptoms: Yes change in energy level No numbness or tingling  No bone pain No unexplained fracture    Dyslipidemia She is taking simvastatin and zetia. Denies side effects.   Depression, recurrent (HCC)/Anxiety, generalized States that with work stress she had increased anxiety and depression. Work assessments ended at time of the anniversary of her husband's death. She continues to have some anxiety. Denies  SI. Does not wish to start counseling at this time.   Chronic seasonal allergic rhinitis States that some days are better than other. She is taking singulair. States that she is not taking as much OTC medications. She is using flonase.   Finger pain  In addition, reports pain in her right second finger. Reports swelling. Has tried some ibuprofen, not helping. Has ROM. States that a similar event happened several years ago, but this is a new flare. Started a few weeks ago.  Relevant past medical, surgical, family, and social history reviewed and updated as indicated.  Allergies and medications reviewed and updated. Data reviewed: Chart in Epic.   Past Medical History:  Diagnosis Date   Allergy    Bronchitis    Glaucoma    History of rabies vaccination 07/2019   run in with raccoon   Hyperlipidemia    Prediabetes 06/18/2020   Psoriasis     Past Surgical History:  Procedure Laterality Date   ABDOMINAL HYSTERECTOMY  1998    Social History   Socioeconomic History   Marital status: Widowed    Spouse name: Not on file   Number of children: Not on file   Years of education: Not on file   Highest education level: Associate degree: occupational, Scientist, product/process development, or vocational program  Occupational History   Not on file  Tobacco Use   Smoking status: Former   Smokeless tobacco: Never  Vaping Use   Vaping status: Never Used  Substance and Sexual Activity   Alcohol use: Yes  Comment: occ   Drug use: Never   Sexual activity: Not Currently  Other Topics Concern   Not on file  Social History Narrative   Not on file   Social Drivers of Health   Financial Resource Strain: Low Risk  (12/13/2023)   Overall Financial Resource Strain (CARDIA)    Difficulty of Paying Living Expenses: Not very hard  Food Insecurity: No Food Insecurity (12/13/2023)   Hunger Vital Sign    Worried About Running Out of Food in the Last Year: Never true    Ran Out of Food in the Last Year: Never true   Transportation Needs: No Transportation Needs (12/13/2023)   PRAPARE - Administrator, Civil Service (Medical): No    Lack of Transportation (Non-Medical): No  Physical Activity: Insufficiently Active (12/13/2023)   Exercise Vital Sign    Days of Exercise per Week: 2 days    Minutes of Exercise per Session: 20 min  Stress: Stress Concern Present (12/13/2023)   [employer] of Occupational Health - Occupational Stress Questionnaire    Feeling of Stress : To some extent  Social Connections: Moderately Isolated (12/13/2023)   Social Connection and Isolation Panel [NHANES]    Frequency of Communication with Friends and Family: Three times a week    Frequency of Social Gatherings with Friends and Family: Once a week    Attends Religious Services: Never    Database administrator or Organizations: Yes    Attends Engineer, structural: More than 4 times per year    Marital Status: Widowed  Intimate Partner Violence: Unknown (01/08/2022)   Received from [facility]   HITS    Physically Hurt: Not on file    Insult or Talk Down To: Not on file    Threaten Physical Harm: Not on file    Scream or Curse: Not on file    Outpatient Encounter Medications as of 12/16/2023  Medication Sig   albuterol (VENTOLIN HFA) 108 (90 Base) MCG/ACT inhaler SMARTSIG:2 Puff(s) By Mouth Every 4 Hours PRN   aspirin EC 81 MG tablet Take 81 mg by mouth daily.   b complex vitamins capsule Take 1 capsule by mouth daily.   brimonidine (ALPHAGAN) 0.15 % ophthalmic solution 1 drop 3 (three) times daily.   cholecalciferol (VITAMIN D3) 25 MCG (1000 UT) tablet Take 2,000 Units by mouth daily.   clotrimazole (LOTRIMIN) 1 % cream Apply 1 application topically as directed. To affected area   diclofenac Sodium (VOLTAREN) 1 % GEL Apply 2 g topically 4 (four) times daily.   ezetimibe (ZETIA) 10 MG tablet Take 1 tablet by mouth once daily   halobetasol (ULTRAVATE) 0.05 % cream Apply topically 2  (two) times daily.   ibuprofen (ADVIL) 600 MG tablet Take 1 tablet (600 mg total) by mouth every 8 (eight) hours as needed.   minocycline (DYNACIN) 50 MG tablet Take 50 mg by mouth 2 (two) times daily.   montelukast (SINGULAIR) 10 MG tablet TAKE 1 TABLET BY MOUTH AT BEDTIME   Multiple Vitamins-Minerals (WOMENS MULTIVITAMIN PO) Take by mouth.   sertraline (ZOLOFT) 100 MG tablet Take 2 tablets (200 mg total) by mouth daily.   simvastatin (ZOCOR) 40 MG tablet Take 1 tablet (40 mg total) by mouth at bedtime.   No facility-administered encounter medications on file as of 12/16/2023.    Allergies  Allergen Reactions   Procaine Shortness Of Breath   Niacin Hives    Review of Systems As per HPI  Objective:  BP 125/80   Pulse 80   Temp 98 F (36.7 C)   Ht 5\' 5"  (1.651 m)   Wt 247 lb (112 kg)   SpO2 95%   BMI 41.10 kg/m    Wt Readings from Last 3 Encounters:  12/16/23 247 lb (112 kg)  04/22/23 251 lb (113.9 kg)  01/21/23 247 lb (112 kg)   Physical Exam Constitutional:      General: She is awake. She is not in acute distress.    Appearance: Normal appearance. She is well-developed and well-groomed. She is morbidly obese. She is not ill-appearing, toxic-appearing or diaphoretic.  Cardiovascular:     Rate and Rhythm: Normal rate and regular rhythm.     Pulses: Normal pulses.          Radial pulses are 2+ on the right side and 2+ on the left side.       Posterior tibial pulses are 2+ on the right side and 2+ on the left side.     Heart sounds: Normal heart sounds. No murmur heard.    No gallop.  Pulmonary:     Effort: Pulmonary effort is normal. No respiratory distress.     Breath sounds: Normal breath sounds. No stridor. No wheezing, rhonchi or rales.  Musculoskeletal:     Cervical back: Full passive range of motion without pain and neck supple.     Right lower leg: No edema.     Left lower leg: No edema.     Comments: Right second finger with erythema, swelling, and tenderness  along metocarpalphalangeal joint and interphalangeal joint   Skin:    General: Skin is warm.     Capillary Refill: Capillary refill takes less than 2 seconds.  Neurological:     General: No focal deficit present.     Mental Status: She is alert, oriented to person, place, and time and easily aroused. Mental status is at baseline.     GCS: GCS eye subscore is 4. GCS verbal subscore is 5. GCS motor subscore is 6.     Motor: No weakness.  Psychiatric:        Attention and Perception: Attention and perception normal.        Mood and Affect: Mood and affect normal.        Speech: Speech normal.        Behavior: Behavior normal. Behavior is cooperative.        Thought Content: Thought content normal. Thought content does not include homicidal or suicidal ideation. Thought content does not include homicidal or suicidal plan.        Cognition and Memory: Cognition and memory normal.        Judgment: Judgment normal.     Results for orders placed or performed in visit on 04/22/23  Bayer DCA Hb A1c Waived   Collection Time: 04/22/23  8:29 AM  Result Value Ref Range   HB A1C (BAYER DCA - WAIVED) 6.1 (H) 4.8 - 5.6 %  Vitamin D, 25-hydroxy   Collection Time: 04/22/23  8:31 AM  Result Value Ref Range   Vit D, 25-Hydroxy 26.0 (L) 30.0 - 100.0 ng/mL  Lipid panel   Collection Time: 04/22/23  8:31 AM  Result Value Ref Range   Cholesterol, Total 172 100 - 199 mg/dL   Triglycerides 79 0 - 149 mg/dL   HDL 54 >40 mg/dL   VLDL Cholesterol Cal 15 5 - 40 mg/dL   LDL Chol Calc (NIH) 347 (H) 0 -  99 mg/dL   Chol/HDL Ratio 3.2 0.0 - 4.4 ratio  CMP14+EGFR   Collection Time: 04/22/23  8:31 AM  Result Value Ref Range   Glucose 116 (H) 70 - 99 mg/dL   BUN 9 8 - 27 mg/dL   Creatinine, Ser 1.61 0.57 - 1.00 mg/dL   eGFR 95 >09 UE/AVW/0.98   BUN/Creatinine Ratio 13 12 - 28   Sodium 138 134 - 144 mmol/L   Potassium 4.2 3.5 - 5.2 mmol/L   Chloride 102 96 - 106 mmol/L   CO2 21 20 - 29 mmol/L   Calcium 9.0  8.7 - 10.3 mg/dL   Total Protein 6.8 6.0 - 8.5 g/dL   Albumin 4.1 3.9 - 4.9 g/dL   Globulin, Total 2.7 1.5 - 4.5 g/dL   Bilirubin Total 0.3 0.0 - 1.2 mg/dL   Alkaline Phosphatase 77 44 - 121 IU/L   AST 18 0 - 40 IU/L   ALT 14 0 - 32 IU/L  CBC with Differential/Platelet   Collection Time: 04/22/23  8:31 AM  Result Value Ref Range   WBC 6.6 3.4 - 10.8 x10E3/uL   RBC 4.19 3.77 - 5.28 x10E6/uL   Hemoglobin 12.0 11.1 - 15.9 g/dL   Hematocrit 11.9 14.7 - 46.6 %   MCV 89 79 - 97 fL   MCH 28.6 26.6 - 33.0 pg   MCHC 32.3 31.5 - 35.7 g/dL   RDW 82.9 56.2 - 13.0 %   Platelets 254 150 - 450 x10E3/uL   Neutrophils 56 Not Estab. %   Lymphs 32 Not Estab. %   Monocytes 7 Not Estab. %   Eos 4 Not Estab. %   Basos 1 Not Estab. %   Neutrophils Absolute 3.7 1.4 - 7.0 x10E3/uL   Lymphocytes Absolute 2.1 0.7 - 3.1 x10E3/uL   Monocytes Absolute 0.5 0.1 - 0.9 x10E3/uL   EOS (ABSOLUTE) 0.2 0.0 - 0.4 x10E3/uL   Basophils Absolute 0.1 0.0 - 0.2 x10E3/uL   Immature Granulocytes 0 Not Estab. %   Immature Grans (Abs) 0.0 0.0 - 0.1 x10E3/uL  Magnesium   Collection Time: 04/22/23  8:31 AM  Result Value Ref Range   Magnesium 1.9 1.6 - 2.3 mg/dL  Vitamin Q65   Collection Time: 04/22/23  8:31 AM  Result Value Ref Range   Vitamin B-12 242 232 - 1,245 pg/mL       04/22/2023    8:12 AM 01/21/2023    8:33 AM 05/20/2022    9:28 AM 11/19/2021    2:14 PM 05/15/2021   10:55 AM  Depression screen PHQ 2/9  Decreased Interest 0 0 0 1 0  Down, Depressed, Hopeless 1 1 1 1 1   PHQ - 2 Score 1 1 1 2 1   Altered sleeping 1 0 0 1 0  Tired, decreased energy 1 1 1 1 1   Change in appetite 0 1 0 0 1  Feeling bad or failure about yourself  0 0 0 0 0  Trouble concentrating 0 1 0 0 0  Moving slowly or fidgety/restless 0 0 0 0 0  Suicidal thoughts 0 0 0 0 0  PHQ-9 Score 3 4 2 4 3   Difficult doing work/chores Not difficult at all Not difficult at all Somewhat difficult Somewhat difficult Not difficult at all        04/22/2023    8:12 AM 01/21/2023    8:34 AM 05/20/2022    9:28 AM 11/19/2021    2:16 PM  GAD 7 : Generalized Anxiety Score  Nervous, Anxious, on Edge 1 1 1 1   Control/stop worrying 0 0 0 0  Worry too much - different things 0 1 0 1  Trouble relaxing 0 0 0 0  Restless 1 0 0 0  Easily annoyed or irritable 0 1 1 1   Afraid - awful might happen 0 0 0 0  Total GAD 7 Score 2 3 2 3   Anxiety Difficulty Not difficult at all Somewhat difficult Somewhat difficult    Pertinent labs & imaging results that were available during my care of the patient were reviewed by me and considered in my medical decision making.  Assessment & Plan:  Miller was seen today for medical management of chronic issues.  Diagnoses and all orders for this visit:  Morbid obesity with BMI of 40.0-44.9, adult [facility]) Discussed with patient strategies for weight loss. Provided handout. Patient to call insurance provider and determine what is covered on her plan.   Vitamin D deficiency Reviewed labs from [place], completed by work. Will start medication as below. Once patient finishes 12 doses to resume daily OTC supplement  -     Vitamin D, Ergocalciferol, (DRISDOL) 1.25 MG (50000 UNIT) CAPS capsule; Take 1 capsule (50,000 Units total) by mouth every 7 (seven) days for 12 doses.  Prediabetes Reviewed labs from work. Stable at 5.8%. Will scan into chart. Discussed with patient to continue healthy lifestyle choices, including diet (rich in fruits, vegetables, and lean proteins, and low in salt and simple carbohydrates) and exercise (at least 30 minutes of moderate physical activity daily). Limit beverages high is sugar. Recommended at least 80-100 oz of water daily.   Dyslipidemia Reviewed labs from work. Patient had increased levels and worsening dyslipidemia. Will switch from simvastatin to medication as below and re-evaluate in 3 months.  -     rosuvastatin (CRESTOR) 40 MG tablet; Take 1 tablet (40 mg total) by mouth  daily.  Depression, recurrent (HCC) Denies SI. Safety contract established. Will add in low dose medication as below for intermittent anxiety and to adjunctively treat depression. Discussed side effects.  - busPIRone (BUSPAR) 5 MG tablet; Take 1 tablet (5 mg total) by mouth 2 (two) times daily.  Dispense: 60 tablet; Refill: 2 - sertraline (ZOLOFT) 100 MG tablet; Take 2 tablets (200 mg total) by mouth daily.  Dispense: 180 tablet; Refill: 1  Anxiety, generalized As above.  - busPIRone (BUSPAR) 5 MG tablet; Take 1 tablet (5 mg total) by mouth 2 (two) times daily.  Dispense: 60 tablet; Refill: 2 - sertraline (ZOLOFT) 100 MG tablet; Take 2 tablets (200 mg total) by mouth daily.  Dispense: 180 tablet; Refill: 1  Chronic seasonal allergic rhinitis Well controlled. Continue current regimen.   Finger joint swelling, right Will start medication as below. If not improved, will consider further evaluation with imaging and possible uric acid lab.  - naproxen (NAPROSYN) 500 MG tablet; Take 1 tablet (500 mg total) by mouth 2 (two) times daily with a meal.  Dispense: 30 tablet; Refill: 0   Continue all other maintenance medications.  Follow up plan: Return in about 3 months (around 03/17/2024) for Chronic Condition Follow up.   Continue healthy lifestyle choices, including diet (rich in fruits, vegetables, and lean proteins, and low in salt and simple carbohydrates) and exercise (at least 30 minutes of moderate physical activity daily).  Written and verbal instructions provided   The above assessment and management plan was discussed with the patient. The patient verbalized understanding of and has agreed to the  management plan. Patient is aware to call the clinic if they develop any new symptoms or if symptoms persist or worsen. Patient is aware when to return to the clinic for a follow-up visit. Patient educated on when it is appropriate to go to the emergency department.   Neale Burly,  DNP-FNP [facility] [address] [place], [place] [address] [phone]

## 2023-12-16 NOTE — Patient Instructions (Signed)
 E2M - Eager to Motivate  P:E diet  Mediterranean Diet  ChatGPT  Move with Joni Reining  Yoga with Adriene  Noom  Weight Watchers

## 2024-02-14 ENCOUNTER — Other Ambulatory Visit: Payer: Self-pay | Admitting: Family Medicine

## 2024-02-14 DIAGNOSIS — E785 Hyperlipidemia, unspecified: Secondary | ICD-10-CM

## 2024-02-22 ENCOUNTER — Other Ambulatory Visit: Payer: Self-pay | Admitting: Family Medicine

## 2024-02-22 DIAGNOSIS — J302 Other seasonal allergic rhinitis: Secondary | ICD-10-CM

## 2024-03-15 ENCOUNTER — Other Ambulatory Visit: Payer: Self-pay | Admitting: Family Medicine

## 2024-03-15 DIAGNOSIS — F411 Generalized anxiety disorder: Secondary | ICD-10-CM

## 2024-03-15 DIAGNOSIS — F339 Major depressive disorder, recurrent, unspecified: Secondary | ICD-10-CM

## 2024-03-30 ENCOUNTER — Ambulatory Visit (INDEPENDENT_AMBULATORY_CARE_PROVIDER_SITE_OTHER): Admitting: Family Medicine

## 2024-03-30 ENCOUNTER — Encounter: Payer: Self-pay | Admitting: Family Medicine

## 2024-03-30 VITALS — BP 130/77 | HR 68 | Temp 98.2°F | Ht 65.0 in | Wt 254.0 lb

## 2024-03-30 DIAGNOSIS — F339 Major depressive disorder, recurrent, unspecified: Secondary | ICD-10-CM | POA: Diagnosis not present

## 2024-03-30 DIAGNOSIS — E559 Vitamin D deficiency, unspecified: Secondary | ICD-10-CM

## 2024-03-30 DIAGNOSIS — R0689 Other abnormalities of breathing: Secondary | ICD-10-CM

## 2024-03-30 DIAGNOSIS — E785 Hyperlipidemia, unspecified: Secondary | ICD-10-CM

## 2024-03-30 DIAGNOSIS — F411 Generalized anxiety disorder: Secondary | ICD-10-CM | POA: Diagnosis not present

## 2024-03-30 DIAGNOSIS — R7303 Prediabetes: Secondary | ICD-10-CM

## 2024-03-30 DIAGNOSIS — Z6841 Body Mass Index (BMI) 40.0 and over, adult: Secondary | ICD-10-CM

## 2024-03-30 DIAGNOSIS — Z9289 Personal history of other medical treatment: Secondary | ICD-10-CM

## 2024-03-30 DIAGNOSIS — J302 Other seasonal allergic rhinitis: Secondary | ICD-10-CM

## 2024-03-30 LAB — VITAMIN D 25 HYDROXY (VIT D DEFICIENCY, FRACTURES)

## 2024-03-30 LAB — BAYER DCA HB A1C WAIVED: HB A1C (BAYER DCA - WAIVED): 5.8 % — ABNORMAL HIGH (ref 4.8–5.6)

## 2024-03-30 MED ORDER — ALBUTEROL SULFATE HFA 108 (90 BASE) MCG/ACT IN AERS: INHALATION_SPRAY | RESPIRATORY_TRACT | 2 refills | Status: AC

## 2024-03-30 MED ORDER — SERTRALINE HCL 100 MG PO TABS
200.0000 mg | ORAL_TABLET | Freq: Every day | ORAL | 1 refills | Status: AC
Start: 2024-03-30 — End: ?

## 2024-03-30 MED ORDER — ROSUVASTATIN CALCIUM 40 MG PO TABS
40.0000 mg | ORAL_TABLET | Freq: Every day | ORAL | 0 refills | Status: AC
Start: 2024-03-30 — End: ?

## 2024-03-30 MED ORDER — BUSPIRONE HCL 5 MG PO TABS
5.0000 mg | ORAL_TABLET | Freq: Two times a day (BID) | ORAL | 2 refills | Status: DC
Start: 2024-03-30 — End: 2024-07-28

## 2024-03-30 NOTE — Progress Notes (Signed)
 Subjective:  [name], female    DOB: 11-12-1955, 68 y.o.   MRN: [id number]  Patient Care Team: Cathlene Marry Lenis, FNP as PCP - General (Family Medicine) Cyrus Carwin, MD as Consulting Physician (Ophthalmology) Livingston Rigg, MD (Inactive) as Consulting Physician (Dermatology) Sebastian Kid, DC as Consulting Physician (Chiropractic Medicine)   Chief Complaint:  Medical Management of Chronic Issues   HPI: Jasmine Kim is a 68 y.o. female presenting on 03/30/2024 for Medical Management of Chronic Issues   HPI Morbid obesity with BMI of 40.0-44.9, adult (HCC)/Prediabetes States that she has not been exercising. She is making limited changes to diet.   Vitamin D  deficiency She completed vitamin D  supplement.  Denies numbness/tingling  Denies bone pain, numbness, tingling, fractures.  Does not two falls recently. She slipped in wet grass, then tripped at work and landed on her knee.   Dyslipidemia  She did not fast today.  She is taking crestor  and zetia .  Denies side effects.   Depression, recurrent (HCC)/6. Anxiety, generalized States that anxiety has improve. Reports depression comes in waves. She states that she is sometimes weepy. Denies SI. Does not desire counseling, but is considering it. She is compliant with zoloft . Taking buspar  once daily. Would like to keep medication as the same.   Chronic seasonal allergic rhinitis States that her sinus pain has been worse the past few weeks. States that it is triggered by heat and humidity. She is having PND, she is having some shortness of breath. Reports that it is happening once or twice daily. She is taking singulair .    Relevant past medical, surgical, family, and social history reviewed and updated as indicated.  Allergies and medications reviewed and updated. Data reviewed: Chart in Epic.   Past Medical History:  Diagnosis Date   Allergy    Anxiety chronic   Had issues for years    Asthma Occasional   Bronchitis    Depression mild   Glaucoma    History of rabies vaccination 07/2019   run in with raccoon   Hyperlipidemia    Hypertension Periodic   Not a regular thing   Prediabetes 06/18/2020   Psoriasis     Past Surgical History:  Procedure Laterality Date   ABDOMINAL HYSTERECTOMY  1998    Social History   Socioeconomic History   Marital status: Widowed    Spouse name: Not on file   Number of children: Not on file   Years of education: Not on file   Highest education level: Associate degree: occupational, Scientist, product/process development, or vocational program  Occupational History   Not on file  Tobacco Use   Smoking status: Former   Smokeless tobacco: Never  Vaping Use   Vaping status: Never Used  Substance and Sexual Activity   Alcohol use: Yes    Comment: occ   Drug use: Never   Sexual activity: Not Currently  Other Topics Concern   Not on file  Social History Narrative   Not on file   Social Drivers of Health   Financial Resource Strain: Low Risk  (03/27/2024)   Overall Financial Resource Strain (CARDIA)    Difficulty of Paying Living Expenses: Not very hard  Food Insecurity: No Food Insecurity (03/27/2024)   Hunger Vital Sign    Worried About Running Out of Food in the Last Year: Never true    Ran Out of Food in the Last Year: Never true  Transportation Needs: No Transportation Needs (03/27/2024)  PRAPARE - Administrator, Civil Service (Medical): No    Lack of Transportation (Non-Medical): No  Physical Activity: Insufficiently Active (03/27/2024)   Exercise Vital Sign    Days of Exercise per Week: 3 days    Minutes of Exercise per Session: 20 min  Stress: No Stress Concern Present (03/27/2024)   [employer] of Occupational Health - Occupational Stress Questionnaire    Feeling of Stress: Only a little  Social Connections: Moderately Isolated (03/27/2024)   Social Connection and Isolation Panel    Frequency of Communication with Friends  and Family: Three times a week    Frequency of Social Gatherings with Friends and Family: Once a week    Attends Religious Services: Patient declined    Active Member of Clubs or Organizations: Yes    Attends Banker Meetings: More than 4 times per year    Marital Status: Widowed  Intimate Partner Violence: Unknown (01/08/2022)   Received from [facility]   HITS    Physically Hurt: Not on file    Insult or Talk Down To: Not on file    Threaten Physical Harm: Not on file    Scream or Curse: Not on file    Outpatient Encounter Medications as of 03/30/2024  Medication Sig   albuterol  (VENTOLIN  HFA) 108 (90 Base) MCG/ACT inhaler SMARTSIG:2 Puff(s) By Mouth Every 4 Hours PRN   aspirin EC 81 MG tablet Take 81 mg by mouth daily.   b complex vitamins capsule Take 1 capsule by mouth daily.   brimonidine (ALPHAGAN) 0.15 % ophthalmic solution 1 drop 3 (three) times daily.   busPIRone  (BUSPAR ) 5 MG tablet Take 1 tablet by mouth twice daily   cholecalciferol (VITAMIN D3) 25 MCG (1000 UT) tablet Take 2,000 Units by mouth daily.   clotrimazole  (LOTRIMIN ) 1 % cream Apply 1 application topically as directed. To affected area   ezetimibe  (ZETIA ) 10 MG tablet Take 1 tablet by mouth once daily   halobetasol (ULTRAVATE) 0.05 % cream Apply topically 2 (two) times daily.   minocycline (DYNACIN) 50 MG tablet Take 50 mg by mouth 2 (two) times daily.   montelukast  (SINGULAIR ) 10 MG tablet TAKE 1 TABLET BY MOUTH AT BEDTIME   Multiple Vitamins-Minerals (WOMENS MULTIVITAMIN PO) Take by mouth.   naproxen  (NAPROSYN ) 500 MG tablet Take 1 tablet (500 mg total) by mouth 2 (two) times daily with a meal.   rosuvastatin  (CRESTOR ) 40 MG tablet Take 1 tablet (40 mg total) by mouth daily.   sertraline  (ZOLOFT ) 100 MG tablet Take 2 tablets (200 mg total) by mouth daily.   No facility-administered encounter medications on file as of 03/30/2024.   Allergies  Allergen Reactions   Procaine Shortness Of Breath    Niacin Hives   Review of Systems As per HPI  Objective:  BP 130/77   Pulse 68   Temp 98.2 F (36.8 C)   Ht 5' 5 (1.651 m)   Wt 254 lb (115.2 kg)   SpO2 95%   BMI 42.27 kg/m    Wt Readings from Last 3 Encounters:  03/30/24 254 lb (115.2 kg)  12/16/23 247 lb (112 kg)  04/22/23 251 lb (113.9 kg)   Physical Exam Constitutional:      General: She is awake. She is not in acute distress.    Appearance: Normal appearance. She is well-developed and well-groomed. She is obese. She is not ill-appearing, toxic-appearing or diaphoretic.   Cardiovascular:     Rate and Rhythm: Normal rate  and regular rhythm.     Heart sounds: Normal heart sounds. No murmur heard.    No gallop.  Pulmonary:     Effort: Pulmonary effort is normal. No respiratory distress.     Breath sounds: Normal breath sounds. No stridor. No wheezing, rhonchi or rales.   Musculoskeletal:     Cervical back: Full passive range of motion without pain and neck supple.   Skin:    General: Skin is warm.     Capillary Refill: Capillary refill takes less than 2 seconds.   Neurological:     General: No focal deficit present.     Mental Status: She is alert, oriented to person, place, and time and easily aroused. Mental status is at baseline.     GCS: GCS eye subscore is 4. GCS verbal subscore is 5. GCS motor subscore is 6.     Motor: No weakness.   Psychiatric:        Attention and Perception: Attention and perception normal.        Mood and Affect: Mood and affect normal.        Speech: Speech normal.        Behavior: Behavior normal. Behavior is cooperative.        Thought Content: Thought content normal. Thought content does not include homicidal or suicidal ideation. Thought content does not include homicidal or suicidal plan.        Cognition and Memory: Cognition and memory normal.        Judgment: Judgment normal.     Results for orders placed or performed in visit on 12/22/23  Lab report - scanned    Collection Time: 12/11/23 12:23 PM  Result Value Ref Range   EGFR 95.0    A1c 5.8    TSH 3.16 0.41 - 5.90       03/30/2024    8:11 AM 12/16/2023    9:11 AM 04/22/2023    8:12 AM 01/21/2023    8:33 AM 05/20/2022    9:28 AM  Depression screen PHQ 2/9  Decreased Interest 1 1 0 0 0  Down, Depressed, Hopeless 0 1 1 1 1   PHQ - 2 Score 1 2 1 1 1   Altered sleeping 1 1 1  0 0  Tired, decreased energy 1 1 1 1 1   Change in appetite 1 2 0 1 0  Feeling bad or failure about yourself  0 0 0 0 0  Trouble concentrating 0 0 0 1 0  Moving slowly or fidgety/restless 0 0 0 0 0  Suicidal thoughts  0 0 0 0  PHQ-9 Score 4 6 3 4 2   Difficult doing work/chores Somewhat difficult Not difficult at all Not difficult at all Not difficult at all Somewhat difficult       03/30/2024    8:11 AM 12/16/2023    9:12 AM 04/22/2023    8:12 AM 01/21/2023    8:34 AM  GAD 7 : Generalized Anxiety Score  Nervous, Anxious, on Edge 1 1 1 1   Control/stop worrying 0 0 0 0  Worry too much - different things 0 0 0 1  Trouble relaxing 0 0 0 0  Restless 1 1 1  0  Easily annoyed or irritable 1 1 0 1  Afraid - awful might happen 0 0 0 0  Total GAD 7 Score 3 3 2 3   Anxiety Difficulty Not difficult at all Somewhat difficult Not difficult at all Somewhat difficult      Pertinent labs &  imaging results that were available during my care of the patient were reviewed by me and considered in my medical decision making.  Assessment & Plan:  Canaan was seen today for medical management of chronic issues.  Diagnoses and all orders for this visit: 1. Morbid obesity with BMI of 40.0-44.9, adult (HCC) (Primary) Discussed with patient to continue healthy lifestyle choices, including diet (rich in fruits, vegetables, and lean proteins, and low in salt and simple carbohydrates) and exercise (at least 30 minutes of moderate physical activity daily). Limit beverages high is sugar. Recommended at least 80-100 oz of water daily.  Labs as  below. Will communicate results to patient once available. Will await results to determine next steps.  - CBC with Differential/Platelet - CMP14+EGFR  2. Depression, recurrent (HCC) Will continue with current regimen. Denies SI. Safety contract established. Refills provided. Patient to consider counseling.  - sertraline  (ZOLOFT ) 100 MG tablet; Take 2 tablets (200 mg total) by mouth daily.  Dispense: 180 tablet; Refill: 1 - busPIRone  (BUSPAR ) 5 MG tablet; Take 1 tablet (5 mg total) by mouth 2 (two) times daily.  Dispense: 60 tablet; Refill: 2  3. Anxiety, generalized As above.  - sertraline  (ZOLOFT ) 100 MG tablet; Take 2 tablets (200 mg total) by mouth daily.  Dispense: 180 tablet; Refill: 1 - busPIRone  (BUSPAR ) 5 MG tablet; Take 1 tablet (5 mg total) by mouth 2 (two) times daily.  Dispense: 60 tablet; Refill: 2  4. Vitamin D  deficiency Labs as below. Will communicate results to patient once available. Will await results to determine next steps.  - VITAMIN D  25 Hydroxy (Vit-D Deficiency, Fractures)  5. Prediabetes Well controlled A1C. Will continue to monitor.  Labs as below. Will communicate results to patient once available. Will await results to determine next steps.  - Bayer DCA Hb A1c Waived - CBC with Differential/Platelet - CMP14+EGFR  6. Dyslipidemia Not fasting  Labs as below. Will communicate results to patient once available. Will await results to determine next steps.  Refills provided of crestor . - Lipid panel - rosuvastatin  (CRESTOR ) 40 MG tablet; Take 1 tablet (40 mg total) by mouth daily.  Dispense: 90 tablet; Refill: 0  7. Chronic seasonal allergic rhinitis Will restart medication as below. Discussed use with patient. No prior chest xray. Recommend that if symptoms continue that she follow up for spirometry testing. Reassuring lung sounds on exam.  - albuterol  (VENTOLIN  HFA) 108 (90 Base) MCG/ACT inhaler; SMARTSIG:2 Puff(s) By Mouth Every 4 Hours PRN  Dispense: 18 g;  Refill: 2  8. Difficulty breathing As above.  - albuterol  (VENTOLIN  HFA) 108 (90 Base) MCG/ACT inhaler; SMARTSIG:2 Puff(s) By Mouth Every 4 Hours PRN  Dispense: 18 g; Refill: 2  9. History of bone density study Imaging as below. Will communicate results to patient once available. Will await results to determine next steps.  - DG [facility] DEXA    Continue all other maintenance medications.  Follow up plan: Return in about 3 months (around 06/30/2024) for Chronic Condition Follow up.   Continue healthy lifestyle choices, including diet (rich in fruits, vegetables, and lean proteins, and low in salt and simple carbohydrates) and exercise (at least 30 minutes of moderate physical activity daily).  Written and verbal instructions provided   The above assessment and management plan was discussed with the patient. The patient verbalized understanding of and has agreed to the management plan. Patient is aware to call the clinic if they develop any new symptoms or if symptoms persist or worsen. Patient  is aware when to return to the clinic for a follow-up visit. Patient educated on when it is appropriate to go to the emergency department.   Marry Kins, DNP-FNP [facility] [address] [place], [place] [address] [phone]

## 2024-03-31 ENCOUNTER — Ambulatory Visit: Payer: Self-pay | Admitting: Family Medicine

## 2024-03-31 LAB — CBC WITH DIFFERENTIAL/PLATELET
Basophils Absolute: 0.1 10*3/uL (ref 0.0–0.2)
Basos: 1 %
EOS (ABSOLUTE): 0.3 10*3/uL (ref 0.0–0.4)
Eos: 5 %
Hematocrit: 38 % (ref 34.0–46.6)
Hemoglobin: 12 g/dL (ref 11.1–15.9)
Immature Grans (Abs): 0 10*3/uL (ref 0.0–0.1)
Immature Granulocytes: 0 %
Lymphocytes Absolute: 1.9 10*3/uL (ref 0.7–3.1)
Lymphs: 29 %
MCH: 29.7 pg (ref 26.6–33.0)
MCHC: 31.6 g/dL (ref 31.5–35.7)
MCV: 94 fL (ref 79–97)
Monocytes Absolute: 0.4 10*3/uL (ref 0.1–0.9)
Monocytes: 7 %
Neutrophils Absolute: 3.7 10*3/uL (ref 1.4–7.0)
Neutrophils: 58 %
Platelets: 247 10*3/uL (ref 150–450)
RBC: 4.04 x10E6/uL (ref 3.77–5.28)
RDW: 12.5 % (ref 11.7–15.4)
WBC: 6.4 10*3/uL (ref 3.4–10.8)

## 2024-03-31 LAB — CMP14+EGFR
ALT: 31 IU/L (ref 0–32)
AST: 25 IU/L (ref 0–40)
Albumin: 4.3 g/dL (ref 3.9–4.9)
Alkaline Phosphatase: 84 IU/L (ref 44–121)
BUN/Creatinine Ratio: 12 (ref 12–28)
BUN: 8 mg/dL (ref 8–27)
Bilirubin Total: <0.2 mg/dL (ref 0.0–1.2)
CO2: 20 mmol/L (ref 20–29)
Calcium: 9.1 mg/dL (ref 8.7–10.3)
Chloride: 104 mmol/L (ref 96–106)
Creatinine, Ser: 0.68 mg/dL (ref 0.57–1.00)
Globulin, Total: 2.2 g/dL (ref 1.5–4.5)
Glucose: 119 mg/dL — ABNORMAL HIGH (ref 70–99)
Potassium: 4.1 mmol/L (ref 3.5–5.2)
Sodium: 140 mmol/L (ref 134–144)
Total Protein: 6.5 g/dL (ref 6.0–8.5)
eGFR: 94 mL/min/{1.73_m2} (ref >59)

## 2024-03-31 LAB — LIPID PANEL
Chol/HDL Ratio: 3.4 ratio (ref 0.0–4.4)
Cholesterol, Total: 179 mg/dL (ref 100–199)
HDL: 53 mg/dL (ref >39)
LDL Chol Calc (NIH): 96 mg/dL (ref 0–99)
Triglycerides: 177 mg/dL — ABNORMAL HIGH (ref 0–149)
VLDL Cholesterol Cal: 30 mg/dL (ref 5–40)

## 2024-03-31 NOTE — Progress Notes (Signed)
 A1C controlled. BG consistent with A1C. Triglycerides slightly elevated. Consistent with non fasting status. The 10-year ASCVD risk score (Arnett DK, et al., 2019) is: 8.5%. Recommend continuing statin.  Vitamin D  in normal range. Recommend continuing daily [phone] international units.

## 2024-04-06 ENCOUNTER — Encounter: Payer: Self-pay | Admitting: *Deleted

## 2024-05-12 ENCOUNTER — Other Ambulatory Visit

## 2024-05-16 ENCOUNTER — Other Ambulatory Visit: Payer: Self-pay | Admitting: *Deleted

## 2024-05-16 ENCOUNTER — Telehealth: Payer: Self-pay | Admitting: Family Medicine

## 2024-05-16 DIAGNOSIS — E785 Hyperlipidemia, unspecified: Secondary | ICD-10-CM

## 2024-05-16 MED ORDER — EZETIMIBE 10 MG PO TABS: 10.0000 mg | ORAL_TABLET | Freq: Every day | ORAL | 0 refills | Status: DC

## 2024-05-16 NOTE — Telephone Encounter (Signed)
 Copied from CRM [id number]. Topic: Appointments - Scheduling Inquiry for Clinic >> May 16, 2024 11:22 AM Tobias CROME wrote: Reason for CRM: Patient needing to reschedule appointment for DEXA scan this Friday, 05/19/24.   Patient has to go to a funeral and requesting call back to reschedule, [phone]

## 2024-05-17 NOTE — Telephone Encounter (Signed)
Pt has appt rescheduled.

## 2024-05-19 ENCOUNTER — Other Ambulatory Visit

## 2024-06-09 ENCOUNTER — Ambulatory Visit (INDEPENDENT_AMBULATORY_CARE_PROVIDER_SITE_OTHER)

## 2024-06-09 ENCOUNTER — Other Ambulatory Visit: Payer: Self-pay | Admitting: Family Medicine

## 2024-06-09 DIAGNOSIS — Z780 Asymptomatic menopausal state: Secondary | ICD-10-CM | POA: Diagnosis not present

## 2024-06-09 DIAGNOSIS — J302 Other seasonal allergic rhinitis: Secondary | ICD-10-CM

## 2024-06-09 DIAGNOSIS — Z9289 Personal history of other medical treatment: Secondary | ICD-10-CM

## 2024-06-09 DIAGNOSIS — F411 Generalized anxiety disorder: Secondary | ICD-10-CM

## 2024-06-09 DIAGNOSIS — F339 Major depressive disorder, recurrent, unspecified: Secondary | ICD-10-CM

## 2024-06-09 DIAGNOSIS — R0689 Other abnormalities of breathing: Secondary | ICD-10-CM

## 2024-06-09 DIAGNOSIS — R7303 Prediabetes: Secondary | ICD-10-CM

## 2024-06-09 DIAGNOSIS — E559 Vitamin D deficiency, unspecified: Secondary | ICD-10-CM

## 2024-06-09 DIAGNOSIS — E785 Hyperlipidemia, unspecified: Secondary | ICD-10-CM

## 2024-06-15 ENCOUNTER — Ambulatory Visit: Payer: Self-pay | Admitting: Family Medicine

## 2024-07-21 ENCOUNTER — Ambulatory Visit: Admitting: Family Medicine

## 2024-07-28 ENCOUNTER — Other Ambulatory Visit: Payer: Self-pay | Admitting: *Deleted

## 2024-07-28 DIAGNOSIS — F411 Generalized anxiety disorder: Secondary | ICD-10-CM

## 2024-07-28 DIAGNOSIS — F339 Major depressive disorder, recurrent, unspecified: Secondary | ICD-10-CM

## 2024-07-28 MED ORDER — BUSPIRONE HCL 5 MG PO TABS: 5.0000 mg | ORAL_TABLET | Freq: Two times a day (BID) | ORAL | 0 refills | Status: DC

## 2024-07-31 ENCOUNTER — Encounter: Payer: Self-pay | Admitting: Family Medicine

## 2024-07-31 ENCOUNTER — Ambulatory Visit: Admitting: Family Medicine

## 2024-07-31 ENCOUNTER — Ambulatory Visit (INDEPENDENT_AMBULATORY_CARE_PROVIDER_SITE_OTHER): Admitting: Family Medicine

## 2024-07-31 VITALS — BP 132/70 | HR 74 | Temp 97.4°F | Ht 65.0 in | Wt 256.0 lb

## 2024-07-31 DIAGNOSIS — E785 Hyperlipidemia, unspecified: Secondary | ICD-10-CM

## 2024-07-31 DIAGNOSIS — R600 Localized edema: Secondary | ICD-10-CM

## 2024-07-31 DIAGNOSIS — R7303 Prediabetes: Secondary | ICD-10-CM | POA: Diagnosis not present

## 2024-07-31 DIAGNOSIS — Z6841 Body Mass Index (BMI) 40.0 and over, adult: Secondary | ICD-10-CM

## 2024-07-31 DIAGNOSIS — F411 Generalized anxiety disorder: Secondary | ICD-10-CM

## 2024-07-31 DIAGNOSIS — E559 Vitamin D deficiency, unspecified: Secondary | ICD-10-CM | POA: Diagnosis not present

## 2024-07-31 DIAGNOSIS — F339 Major depressive disorder, recurrent, unspecified: Secondary | ICD-10-CM

## 2024-07-31 DIAGNOSIS — J302 Other seasonal allergic rhinitis: Secondary | ICD-10-CM

## 2024-07-31 LAB — BAYER DCA HB A1C WAIVED: HB A1C (BAYER DCA - WAIVED): 5.7 % — ABNORMAL HIGH (ref 4.8–5.6)

## 2024-07-31 NOTE — Progress Notes (Signed)
 Established Patient Office Visit  Subjective   [name], female    DOB: [birth year]  Age: 68 y.o. MRN: [id number]  Chief Complaint  Patient presents with   Medical Management of Chronic Issues    HPI  History of Present Illness   Jasmine Kim is a 68 year old female who presents for establishing care.   Prediabetes and obesity - Last hemoglobin A1c was 5.8%. - Diet is inconsistent, described as 'yo-yoing.' - No regular exercise  Hyperlipidemia - History of elevated cholesterol. - Currently taking rosuvastatin  and Zetia . - Fasting today for cholesterol testing.  Anxiety and depressive symptoms - Takes Zoloft  and Buspar  for management. - Recent exacerbation of symptoms due to her late husband's birthday. - Took time off work to avoid stress for that weekend - Describes job as generally good but challenging due to public interaction.  Sleep disturbance and nocturia - Sleep quality is inconsistent. - Nocturia occurs at least three times per week, typically once per night. - Urgent need to urinate when urge arises, but not bothersome during the day.  Peripheral edema and ankle discomfort - Swelling around ankles, especially where socks are worn. - Edema absent in the morning, worsens throughout the day. - No orthopnea, chest pain, shortness of breath  Allergic rhinitis and upper respiratory symptoms - Takes Singulair  with good effect for allergy symptoms. - Occasionally uses antihistamines for severe symptoms, which can cause headaches.  Psoriasis and otic fullness - History of psoriasis, managed with medicated shampoo. - Cleans ears with shampoo, suspects psoriasis may cause sensation of fullness in ears. - No earwax buildup. - Established with dermatology for management          07/31/2024    8:10 AM 03/30/2024    8:11 AM 12/16/2023    9:11 AM  Depression screen PHQ 2/9  Decreased Interest 1 1 1   Down, Depressed, Hopeless 0 0 1  PHQ - 2  Score 1 1 2   Altered sleeping 1 1 1   Tired, decreased energy 1 1 1   Change in appetite 1 1 2   Feeling bad or failure about yourself  0 0 0  Trouble concentrating 0 0 0  Moving slowly or fidgety/restless 0 0 0  Suicidal thoughts 0  0  PHQ-9 Score 4 4 6   Difficult doing work/chores Somewhat difficult Somewhat difficult Not difficult at all      07/31/2024    8:11 AM 03/30/2024    8:11 AM 12/16/2023    9:12 AM 04/22/2023    8:12 AM  GAD 7 : Generalized Anxiety Score  Nervous, Anxious, on Edge 1 1 1 1   Control/stop worrying 0 0 0 0  Worry too much - different things 0 0 0 0  Trouble relaxing 0 0 0 0  Restless 1 1 1 1   Easily annoyed or irritable 0 1 1 0  Afraid - awful might happen 0 0 0 0  Total GAD 7 Score 2 3 3 2   Anxiety Difficulty Somewhat difficult Not difficult at all Somewhat difficult Not difficult at all       ROS As per HPI.    Objective:     BP 132/70   Pulse 74   Temp (!) 97.4 F (36.3 C) (Temporal)   Ht 5' 5 (1.651 m)   Wt 256 lb (116.1 kg)   SpO2 96%   BMI 42.60 kg/m  Wt Readings from Last 3 Encounters:  07/31/24 256 lb (116.1 kg)  03/30/24 254  lb (115.2 kg)  12/16/23 247 lb (112 kg)      Physical Exam Vitals and nursing note reviewed.  Constitutional:      General: She is not in acute distress.    Appearance: She is obese. She is not ill-appearing, toxic-appearing or diaphoretic.  HENT:     Right Ear: Tympanic membrane, ear canal and external ear normal.     Left Ear: Tympanic membrane, ear canal and external ear normal.  Cardiovascular:     Rate and Rhythm: Normal rate and regular rhythm.     Pulses: Normal pulses.     Heart sounds: Normal heart sounds. No murmur heard. Pulmonary:     Effort: Pulmonary effort is normal. No respiratory distress.     Breath sounds: Normal breath sounds.  Musculoskeletal:     Cervical back: Neck supple. No rigidity.     Right lower leg: No edema.     Left lower leg: No edema.  Skin:    General: Skin is  warm and dry.  Neurological:     General: No focal deficit present.     Mental Status: She is alert and oriented to person, place, and time.  Psychiatric:        Mood and Affect: Mood normal.        Behavior: Behavior normal.      No results found for any visits on 07/31/24.    The 10-year ASCVD risk score (Arnett DK, et al., 2019) is: 8.8%    Assessment & Plan:   [name] was seen today for medical management of chronic issues.  Diagnoses and all orders for this visit:  Morbid obesity with BMI of 40.0-44.9, adult (HCC)  Prediabetes -     Bayer DCA Hb A1c Waived  Vitamin D  deficiency  Dyslipidemia -     Lipid panel  Peripheral edema  Depression, recurrent  Anxiety, generalized  Chronic seasonal allergic rhinitis   Assessment and Plan    Prediabetes Recent A1c of 5.8% indicating prediabetes. - Order A1c test today.  Hyperlipidemia Managed with rosuvastatin  and Zetia . - Order cholesterol panel today as she is fasting  Lower extremity edema Likely due to venous insufficiency. - Recommend use of compression socks, to be worn first thing in the morning. - Advise to avoid prolonged sitting or standing, and to move around every 20 minutes.  Generalized anxiety disorder and depression Managed with Zoloft  and Buspar . Symptoms well controlled.  Psoriasis Concerns about possible psoriasis affecting ears. - Recommend consulting dermatologist for potential ear-specific treatment, possibly steroid-based.  Allergic rhinitis Managed with Singulair .      Return in about 6 months (around 01/29/2025) for CPE.   The patient indicates understanding of these issues and agrees with the plan.  Annabella CHRISTELLA Search, FNP

## 2024-08-01 LAB — LIPID PANEL
Chol/HDL Ratio: 4.4 ratio (ref 0.0–4.4)
Cholesterol, Total: 231 mg/dL — ABNORMAL HIGH (ref 100–199)
HDL: 53 mg/dL (ref >39)
LDL Chol Calc (NIH): 153 mg/dL — ABNORMAL HIGH (ref 0–99)
Triglycerides: 142 mg/dL (ref 0–149)
VLDL Cholesterol Cal: 25 mg/dL (ref 5–40)

## 2024-08-02 ENCOUNTER — Ambulatory Visit: Payer: Self-pay | Admitting: Family Medicine

## 2024-08-12 ENCOUNTER — Other Ambulatory Visit: Payer: Self-pay | Admitting: *Deleted

## 2024-08-12 DIAGNOSIS — E785 Hyperlipidemia, unspecified: Secondary | ICD-10-CM

## 2024-08-21 ENCOUNTER — Other Ambulatory Visit: Payer: Self-pay | Admitting: Family Medicine

## 2024-08-21 DIAGNOSIS — Z1231 Encounter for screening mammogram for malignant neoplasm of breast: Secondary | ICD-10-CM

## 2024-08-25 ENCOUNTER — Other Ambulatory Visit: Payer: Self-pay | Admitting: Family Medicine

## 2024-08-25 DIAGNOSIS — F411 Generalized anxiety disorder: Secondary | ICD-10-CM

## 2024-08-25 DIAGNOSIS — F339 Major depressive disorder, recurrent, unspecified: Secondary | ICD-10-CM

## 2024-08-29 ENCOUNTER — Other Ambulatory Visit: Payer: Self-pay | Admitting: *Deleted

## 2024-08-29 DIAGNOSIS — J302 Other seasonal allergic rhinitis: Secondary | ICD-10-CM

## 2024-08-29 MED ORDER — MONTELUKAST SODIUM 10 MG PO TABS: 10.0000 mg | ORAL_TABLET | Freq: Every day | ORAL | 1 refills | Status: AC

## 2024-09-20 ENCOUNTER — Inpatient Hospital Stay: Admission: RE | Admit: 2024-09-20 | Discharge: 2024-09-20

## 2024-09-20 DIAGNOSIS — Z1231 Encounter for screening mammogram for malignant neoplasm of breast: Secondary | ICD-10-CM

## 2025-02-08 ENCOUNTER — Encounter: Payer: Self-pay | Admitting: Family Medicine
